# Patient Record
Sex: Female | Born: 1970 | Hispanic: Yes | Marital: Single | Smoking: Never smoker
Health system: Southern US, Community
[De-identification: ages and names within clinical notes are randomized; demographics above are authoritative.]

## PROBLEM LIST (undated history)

## (undated) DIAGNOSIS — K219 Gastro-esophageal reflux disease without esophagitis: Secondary | ICD-10-CM

## (undated) DIAGNOSIS — F32A Depression, unspecified: Secondary | ICD-10-CM

## (undated) DIAGNOSIS — F419 Anxiety disorder, unspecified: Secondary | ICD-10-CM

## (undated) DIAGNOSIS — T7840XA Allergy, unspecified, initial encounter: Secondary | ICD-10-CM

## (undated) DIAGNOSIS — F329 Major depressive disorder, single episode, unspecified: Secondary | ICD-10-CM

## (undated) DIAGNOSIS — D649 Anemia, unspecified: Secondary | ICD-10-CM

## (undated) HISTORY — DX: Anemia, unspecified: D64.9

## (undated) HISTORY — DX: Anxiety disorder, unspecified: F41.9

## (undated) HISTORY — DX: Major depressive disorder, single episode, unspecified: F32.9

## (undated) HISTORY — DX: Allergy, unspecified, initial encounter: T78.40XA

## (undated) HISTORY — DX: Gastro-esophageal reflux disease without esophagitis: K21.9

## (undated) HISTORY — DX: Depression, unspecified: F32.A

---

## 2000-03-03 ENCOUNTER — Emergency Department (HOSPITAL_COMMUNITY): Admission: EM | Admit: 2000-03-03 | Discharge: 2000-03-03 | Payer: Self-pay | Admitting: Emergency Medicine

## 2000-03-17 ENCOUNTER — Emergency Department (HOSPITAL_COMMUNITY): Admission: EM | Admit: 2000-03-17 | Discharge: 2000-03-17 | Payer: Self-pay | Admitting: Emergency Medicine

## 2000-05-11 ENCOUNTER — Emergency Department (HOSPITAL_COMMUNITY): Admission: EM | Admit: 2000-05-11 | Discharge: 2000-05-11 | Payer: Self-pay | Admitting: *Deleted

## 2006-03-17 ENCOUNTER — Other Ambulatory Visit: Admission: RE | Admit: 2006-03-17 | Discharge: 2006-03-17 | Payer: Self-pay | Admitting: Gynecology

## 2011-12-01 ENCOUNTER — Other Ambulatory Visit: Payer: Self-pay | Admitting: Obstetrics and Gynecology

## 2011-12-01 DIAGNOSIS — Z1231 Encounter for screening mammogram for malignant neoplasm of breast: Secondary | ICD-10-CM

## 2011-12-02 ENCOUNTER — Ambulatory Visit (INDEPENDENT_AMBULATORY_CARE_PROVIDER_SITE_OTHER): Payer: Self-pay | Admitting: *Deleted

## 2011-12-02 ENCOUNTER — Ambulatory Visit (HOSPITAL_COMMUNITY)
Admission: RE | Admit: 2011-12-02 | Discharge: 2011-12-02 | Disposition: A | Discharge status: Home / Self Care | Payer: Self-pay | Source: Ambulatory Visit | Attending: Obstetrics and Gynecology | Admitting: Obstetrics and Gynecology

## 2011-12-02 VITALS — BP 98/61 | HR 65 | Temp 97.5°F | Resp 16 | Ht <= 58 in | Wt 145.5 lb

## 2011-12-02 DIAGNOSIS — Z1231 Encounter for screening mammogram for malignant neoplasm of breast: Secondary | ICD-10-CM

## 2011-12-02 DIAGNOSIS — Z1239 Encounter for other screening for malignant neoplasm of breast: Secondary | ICD-10-CM

## 2011-12-02 NOTE — Progress Notes (Signed)
No complaints today.  Pap Smear:    Pap smear not performed today. Patients last Pap smear was 03/04/10 at one of the free Pap smear screenings and was normal per patient. Per patient she has no history of abnormal Pap smears.Told patient about free Pap smear screenings offered at the [facility] next year if would like to get a Pap smear next year. Per [facility] guidelines if patient has no history of an abnormal Pap smear [facility] will cover Pap smears every 3 years. No Pap smear results in EPIC.   Physical exam: Breasts Breasts symmetrical. No skin abnormalities bilateral breasts. No nipple retraction bilateral breasts. No nipple discharge bilateral breasts. No lymphadenopathy. No lumps palpated bilateral breasts. Patient complains of tenderness in left axilla at times but no complaints of tenderness on palpation.        Pelvic/Bimanual No Pap smear completed today since last Pap smear was 03/04/10 and normal per patient. Pap smear not indicated per [facility] guidelines.

## 2011-12-02 NOTE — Patient Instructions (Signed)
Taught patient how to perform BSE and gave educational materials to take home. Patient did not need a Pap smear today due to last Pap smear was 03/04/10 at one of the free Pap smear screenings per patient. Patient brought letter showing Pap smear appointment. Told patient about free cervical cancer screenings to receive a Pap smear if would like one next year. Let her know [facility] will cover Pap smears every 3 years unless has a history of abnormal Pap smears. Patient escorted to mammography for screening mammogram. Let patient know will follow up with her within the next couple weeks with results by letter or phone. Patient verbalized understanding.

## 2013-06-23 ENCOUNTER — Other Ambulatory Visit: Payer: Self-pay | Admitting: Obstetrics and Gynecology

## 2013-06-23 DIAGNOSIS — Z1231 Encounter for screening mammogram for malignant neoplasm of breast: Secondary | ICD-10-CM

## 2013-07-05 ENCOUNTER — Inpatient Hospital Stay (HOSPITAL_COMMUNITY): Admission: RE | Admit: 2013-07-05 | Payer: Self-pay | Source: Ambulatory Visit

## 2013-07-05 ENCOUNTER — Ambulatory Visit (HOSPITAL_COMMUNITY): Admission: RE | Admit: 2013-07-05 | Payer: Self-pay | Source: Ambulatory Visit

## 2013-09-27 ENCOUNTER — Ambulatory Visit (HOSPITAL_COMMUNITY): Payer: Self-pay

## 2013-09-27 ENCOUNTER — Ambulatory Visit (HOSPITAL_COMMUNITY)
Admission: RE | Admit: 2013-09-27 | Discharge: 2013-09-27 | Disposition: A | Discharge status: Home / Self Care | Payer: Self-pay | Source: Ambulatory Visit | Attending: Obstetrics and Gynecology | Admitting: Obstetrics and Gynecology

## 2013-09-27 DIAGNOSIS — Z1231 Encounter for screening mammogram for malignant neoplasm of breast: Secondary | ICD-10-CM

## 2013-10-06 ENCOUNTER — Other Ambulatory Visit: Payer: Self-pay | Admitting: Obstetrics and Gynecology

## 2013-10-06 DIAGNOSIS — Z1231 Encounter for screening mammogram for malignant neoplasm of breast: Secondary | ICD-10-CM

## 2013-10-18 ENCOUNTER — Ambulatory Visit (HOSPITAL_COMMUNITY)
Admission: RE | Admit: 2013-10-18 | Discharge: 2013-10-18 | Disposition: A | Discharge status: Home / Self Care | Payer: Self-pay | Source: Ambulatory Visit | Attending: Obstetrics and Gynecology | Admitting: Obstetrics and Gynecology

## 2013-10-18 ENCOUNTER — Encounter (INDEPENDENT_AMBULATORY_CARE_PROVIDER_SITE_OTHER): Payer: Self-pay

## 2013-10-18 ENCOUNTER — Encounter (HOSPITAL_COMMUNITY): Payer: Self-pay

## 2013-10-18 VITALS — BP 100/60 | Temp 98.7°F | Ht 59.0 in | Wt 142.6 lb

## 2013-10-18 DIAGNOSIS — Z01419 Encounter for gynecological examination (general) (routine) without abnormal findings: Secondary | ICD-10-CM

## 2013-10-18 DIAGNOSIS — Z1231 Encounter for screening mammogram for malignant neoplasm of breast: Secondary | ICD-10-CM

## 2013-10-18 NOTE — Progress Notes (Signed)
Complaints of occasional bilateral breast tenderness.  Pap Smear:    Pap smear completed today. Patients last Pap smear was 03/04/2010 at one of the free cervical cancer screenings offered by [facility] and normal. Per patient has no history of an abnormal Pap smear. No Pap smear results in EPIC.  Physical exam: Breasts Breasts symmetrical. No skin abnormalities bilateral breasts. No nipple retraction bilateral breasts. No nipple discharge bilateral breasts. No lymphadenopathy. No lumps palpated bilateral breasts. No complaints of pain or tenderness on exam. Patient escorted to mammography for a screening mammogram.         Pelvic/Bimanual   Ext Genitalia No lesions, no swelling and no discharge observed on external genitalia.         Vagina Vagina pink and normal texture. No lesions or discharge observed in vagina.          Cervix Cervix is present. Cervix pink and of normal texture. Small amount of menstrual bleeding observed on exam. Per patient menstrual period is due to start.      Uterus Uterus is present and palpable. Uterus in normal position and normal size.        Adnexae Bilateral ovaries present and palpable. No tenderness on palpation.          Rectovaginal No rectal exam completed today since patient had no rectal complaints. No skin abnormalities observed on exam.

## 2013-10-18 NOTE — Patient Instructions (Signed)
Taught Ashley Meyers how to perform BSE and gave educational materials to take home. Let her know [facility] will cover Pap smears every 3 years unless has a history of abnormal Pap smears. Let patient know will follow up with her within the next couple weeks with results with results by letter and/or phone. Ashley Meyers verbalized understanding. Patient escorted to mammography for a screening mammogram.  Dorrien Grunder, Kathaleen Maser, RN 9:39 AM

## 2013-10-20 ENCOUNTER — Telehealth (HOSPITAL_COMMUNITY): Payer: Self-pay | Admitting: *Deleted

## 2013-10-20 NOTE — Telephone Encounter (Signed)
Telephoned patient at home # and discussed results of negative pap smear. Next pap smear due in 3 years. Patient voiced understanding.  

## 2013-10-25 ENCOUNTER — Other Ambulatory Visit: Payer: Self-pay | Admitting: Obstetrics and Gynecology

## 2013-10-25 DIAGNOSIS — R928 Other abnormal and inconclusive findings on diagnostic imaging of breast: Secondary | ICD-10-CM

## 2013-11-11 ENCOUNTER — Ambulatory Visit
Admission: RE | Admit: 2013-11-11 | Discharge: 2013-11-11 | Disposition: A | Discharge status: Home / Self Care | Payer: No Typology Code available for payment source | Source: Ambulatory Visit | Attending: Obstetrics and Gynecology | Admitting: Obstetrics and Gynecology

## 2013-11-11 DIAGNOSIS — R928 Other abnormal and inconclusive findings on diagnostic imaging of breast: Secondary | ICD-10-CM

## 2014-04-11 ENCOUNTER — Telehealth (HOSPITAL_COMMUNITY): Payer: Self-pay | Admitting: *Deleted

## 2014-04-11 NOTE — Telephone Encounter (Signed)
Telephoned patient at home # and left message to return call to [facility] 

## 2014-05-24 ENCOUNTER — Other Ambulatory Visit: Payer: Self-pay | Admitting: Obstetrics and Gynecology

## 2014-05-24 DIAGNOSIS — N63 Unspecified lump in unspecified breast: Secondary | ICD-10-CM

## 2014-06-02 ENCOUNTER — Ambulatory Visit
Admission: RE | Admit: 2014-06-02 | Discharge: 2014-06-02 | Disposition: A | Discharge status: Home / Self Care | Payer: No Typology Code available for payment source | Source: Ambulatory Visit | Attending: Obstetrics and Gynecology | Admitting: Obstetrics and Gynecology

## 2014-06-02 ENCOUNTER — Encounter (INDEPENDENT_AMBULATORY_CARE_PROVIDER_SITE_OTHER): Payer: Self-pay

## 2014-06-02 DIAGNOSIS — N63 Unspecified lump in unspecified breast: Secondary | ICD-10-CM

## 2014-10-14 ENCOUNTER — Emergency Department (HOSPITAL_COMMUNITY): Admission: EM | Admit: 2014-10-14 | Discharge: 2014-10-14 | Discharge status: Absent without leave | Payer: Self-pay

## 2014-10-23 ENCOUNTER — Encounter (HOSPITAL_COMMUNITY): Payer: Self-pay

## 2014-11-07 ENCOUNTER — Other Ambulatory Visit (HOSPITAL_COMMUNITY): Payer: Self-pay | Admitting: *Deleted

## 2014-11-07 DIAGNOSIS — R921 Mammographic calcification found on diagnostic imaging of breast: Secondary | ICD-10-CM

## 2014-11-23 ENCOUNTER — Ambulatory Visit
Admission: RE | Admit: 2014-11-23 | Discharge: 2014-11-23 | Disposition: A | Discharge status: Home / Self Care | Payer: No Typology Code available for payment source | Source: Ambulatory Visit | Attending: Obstetrics and Gynecology | Admitting: Obstetrics and Gynecology

## 2014-11-23 ENCOUNTER — Encounter (HOSPITAL_COMMUNITY): Payer: Self-pay

## 2014-11-23 ENCOUNTER — Ambulatory Visit (HOSPITAL_COMMUNITY)
Admission: RE | Admit: 2014-11-23 | Discharge: 2014-11-23 | Disposition: A | Discharge status: Home / Self Care | Payer: Self-pay | Source: Ambulatory Visit | Attending: Obstetrics and Gynecology | Admitting: Obstetrics and Gynecology

## 2014-11-23 VITALS — BP 102/68 | Ht 59.0 in | Wt 151.0 lb

## 2014-11-23 DIAGNOSIS — Z1239 Encounter for other screening for malignant neoplasm of breast: Secondary | ICD-10-CM

## 2014-11-23 DIAGNOSIS — R921 Mammographic calcification found on diagnostic imaging of breast: Secondary | ICD-10-CM

## 2014-11-23 NOTE — Patient Instructions (Signed)
Explained to [name] that she did not need a Pap smear today due to last Pap smear was 10/18/2013. Let her know [facility] will cover Pap smears every 3 years unless has a history of abnormal Pap smears. Referred patient to the [facility] for diagnostic mammogram per recommendation. Appointment scheduled for Thursday, November 23, 2014 at 1030. Patient aware of appointment and will be there. Macaria Lucretia Roers verbalized understanding.  Makaylie Dedeaux, [name], RN 9:24 AM

## 2014-11-23 NOTE — Progress Notes (Signed)
Complaints of occasional pulsating discomfort bilateral breasts. Patient rated pain at a 2 out of 10.  Pap Smear:  Pap smear not completed today. Last Pap smear was 10/18/2013 at [facility] and normal. Per patient has no history of an abnormal Pap smear. Last Pap smear result is in EPIC.  Physical exam: Breasts Breasts symmetrical. No skin abnormalities bilateral breasts. No nipple retraction bilateral breasts. No nipple discharge bilateral breasts. No lymphadenopathy. No lumps palpated bilateral breasts. No complaints of pain or tenderness on exam. Patient escorted to mammography for a screening mammogram. Referred patient to the [facility] for diagnostic mammogram per recommendation. Appointment scheduled for Thursday, November 23, 2014 at 1030.  Pelvic/Bimanual No Pap smear completed today since last Pap smear was 10/18/2013. Pap smear not indicated per [facility] guidelines.

## 2014-11-24 ENCOUNTER — Ambulatory Visit (HOSPITAL_BASED_OUTPATIENT_CLINIC_OR_DEPARTMENT_OTHER): Payer: No Typology Code available for payment source

## 2014-11-24 ENCOUNTER — Other Ambulatory Visit: Payer: No Typology Code available for payment source

## 2014-11-24 VITALS — BP 105/70 | HR 60 | Temp 98.5°F | Resp 14 | Ht <= 58 in | Wt 147.5 lb

## 2014-11-24 DIAGNOSIS — Z0000 Encounter for general adult medical examination without abnormal findings: Secondary | ICD-10-CM

## 2014-11-24 LAB — GLUCOSE (CC13): Glucose: 84 mg/dl (ref 70–140)

## 2014-11-24 NOTE — Progress Notes (Signed)
Patient is a new patient to the [facility] program and is currently a [facility] patient effective 11/23/2014.   Clinical Measurements: Patient is 4 ft. 6 1/2 inches, weight 147.5 lbs, waist circumference 36 inches, and hip circumference 41.5 inches.   Medical History: Patient has no history of high cholesterol or diabetes.Patient states has no history of hypertension.   Per patient no diagnosed history of coronary heart disease, heart attack, heart failure, stroke/TIA, vascular disease or congenital heart defects.   Blood Pressure, Self-measurement: Patient states that does not have any reason to check Blood pressure.   Nutrition Assessment: Patient stated that eats 1 fruit every day. Patient states she eats one servings of vegetables a day. Per patient does not eat 3 or more ounces of whole grains daily. Patient states doesn't eat two or more servings of fish weekly. Patient states she does not drink more than 36 ounces or 450 calories of beverages with added sugars weekly. Patient stated she does watch her salt intake.   Physical Activity Assessment: Patient states that walks to third floor 4 to 6 times a day, cleans, walks dog for around 1110 minutes a week. Patient states runs after kids on playground for around 60 minutes of vigorous exercise a week.  Smoking Status: Patient had never smoked and is not around smoke.  Quality of Life Assessment: In assessing patient's quality of life she stated that out of the past 30 days that she has felt her Physical health was good all days. Patient also stated that in the past 30 days that her mental health is not good including stress, depression and problems with emotions for 2 to 3 days. Patient did state that out of the past 30 days she felt her physical or mental health had not kept her from doing her usual activities including self-care, work or recreation.   Plan: Lab work will be done today including a lipid panel, blood glucose, and Hgb A1C. Will call  lab results when they are finished. Will discuss Health Coaching using the [facility] when lab results called and her readiness level.

## 2014-11-24 NOTE — Patient Instructions (Signed)
Discussed health assessment with patient. Will decide on programs she wants when call lab results. Let patient know that will call her on Monday with lab results and make appointment at [facility] if needed. Informed not to let [facility] do any labs or procedures until goes to eligibility and get orange card or patient assistance. Patient verbalized understanding.

## 2014-11-25 LAB — LIPID PANEL
Cholesterol: 175 mg/dL (ref 0–200)
HDL: 67 mg/dL (ref >39)
LDL Cholesterol: 87 mg/dL (ref 0–99)
Total CHOL/HDL Ratio: 2.6 Ratio
Triglycerides: 107 mg/dL (ref <150)
VLDL: 21 mg/dL (ref 0–40)

## 2014-11-25 LAB — HEMOGLOBIN A1C
Hgb A1c MFr Bld: 5.9 % — ABNORMAL HIGH (ref <5.7)
Mean Plasma Glucose: 123 mg/dL — ABNORMAL HIGH (ref <117)

## 2014-11-27 ENCOUNTER — Telehealth: Payer: Self-pay

## 2014-11-27 NOTE — Telephone Encounter (Signed)
Called to give lab results and schedule for Health Coaching.

## 2014-12-01 ENCOUNTER — Ambulatory Visit: Payer: Self-pay

## 2014-12-01 NOTE — Patient Instructions (Signed)
Patient will follow 1600 calorie diet plan. Will review all handouts and exercise/activity book. Will increase exercise and use pedometer. Will measure portion sizes. Will call if has any questions. Will call patient in three weeks. Patient verbalized understanding. 

## 2014-12-01 NOTE — Progress Notes (Signed)
Patient returns today for Health Coaching regarding Nutrition for her borderline AIC and activity.   NUTRITION: Patient and I went over Leith Numbers again.Patient reviewed A1C handout to see about prediabetes, normal and diabetes range. Patient went over what prediabetes is, complications that can result if do not get levels to normal, and diabetes level. Discussed increasing fiber in diet, reading labels and serving sizes. Discussed watching carbohydrates, how to count them, serving sizes and number of carbs per day allowance. Patient stated that she knew she was eating too much and knew what to do.. Patient and I went over 1,600 cal diet and we broke it down to the number of servings she could have. Patient received and reviewed the following handouts in Spanish: My Plate, Carb counting Menu, prediabetes, A1C Exam, Make half your grains whole, choosing whole grains, and A1C. Gave patient measuring cup to measure serving sizes, snack container, water bottle, and demonstrated the serving sizes.  ACTIVITY: Discussed activity and walking. Patient received pedometer. Pedometer was explained and shown how it works. Patients goal is to take 7,500 steps a day at least 3 times a week. Received and reviewed Exercise and Physical Activity [employer] book in Spanish. Given stretch band for exercising.  PLAN: Increase amount of walking per day and add other exercises to plan. Decrease carbohydrates in diet. Lose weight. Follow up times three per phone unless needs and can call for questions. Recheck A1C in 3 months.

## 2015-04-30 ENCOUNTER — Telehealth: Payer: Self-pay

## 2015-04-30 NOTE — Telephone Encounter (Signed)
Patient was not available for 2nd health coaching concerning A1c and LDL's.  Will call back.

## 2015-05-01 NOTE — Telephone Encounter (Signed)
Called per Lavon Paganini (Hispanic Interpreter) for [facility] regarding Health Coaching. Was unable to reach patient. Will attempt again.

## 2015-07-12 ENCOUNTER — Telehealth: Payer: Self-pay

## 2015-07-12 NOTE — Telephone Encounter (Signed)
Called per Anastasio Auerbach (Hispanic Interpreter) for [facility] regarding Health Coaching. Patient stated that was good. Per patient is still watching her carbohydrates and sweets. . Patient stated that was doing good on [facility](15 min.).

## 2015-08-23 ENCOUNTER — Ambulatory Visit: Payer: Self-pay | Admitting: Podiatry

## 2015-08-31 ENCOUNTER — Emergency Department (HOSPITAL_COMMUNITY)
Admission: EM | Admit: 2015-08-31 | Discharge: 2015-08-31 | Discharge status: Against Medical Advice | Payer: Self-pay | Attending: Emergency Medicine | Admitting: Emergency Medicine

## 2015-08-31 ENCOUNTER — Encounter (HOSPITAL_COMMUNITY): Payer: Self-pay | Admitting: *Deleted

## 2015-08-31 DIAGNOSIS — R1032 Left lower quadrant pain: Secondary | ICD-10-CM | POA: Insufficient documentation

## 2015-08-31 MED ORDER — OXYCODONE-ACETAMINOPHEN 5-325 MG PO TABS
ORAL_TABLET | ORAL | Status: AC
  Filled 2015-08-31: qty 1

## 2015-08-31 MED ORDER — OXYCODONE-ACETAMINOPHEN 5-325 MG PO TABS: 1.0000 | ORAL_TABLET | Freq: Once | ORAL | Status: DC

## 2015-08-31 NOTE — ED Notes (Addendum)
Pt reports LLQ pain x 4 months, has been to pcp and had US done which diagnosed her with ovarian cyst. Pt reports still having severe pain. Denies n/v/d. No relief with ibuprofen at home.

## 2015-08-31 NOTE — ED Notes (Signed)
Pt refused labs at triage, states she had them done recently (in July) and did not want them repeated. Also refused pain medication.

## 2015-08-31 NOTE — ED Notes (Addendum)
Called to go back to room and unable to locate pt.

## 2015-09-11 ENCOUNTER — Other Ambulatory Visit (HOSPITAL_COMMUNITY): Payer: Self-pay | Admitting: Nurse Practitioner

## 2015-09-11 DIAGNOSIS — N838 Other noninflammatory disorders of ovary, fallopian tube and broad ligament: Secondary | ICD-10-CM

## 2015-09-13 ENCOUNTER — Ambulatory Visit (HOSPITAL_COMMUNITY): Payer: Self-pay

## 2015-09-19 ENCOUNTER — Encounter (HOSPITAL_COMMUNITY): Payer: Self-pay

## 2015-09-19 ENCOUNTER — Ambulatory Visit (HOSPITAL_COMMUNITY)
Admission: RE | Admit: 2015-09-19 | Discharge: 2015-09-19 | Disposition: A | Discharge status: Home / Self Care | Payer: Self-pay | Source: Ambulatory Visit | Attending: Nurse Practitioner | Admitting: Nurse Practitioner

## 2015-09-19 DIAGNOSIS — N838 Other noninflammatory disorders of ovary, fallopian tube and broad ligament: Secondary | ICD-10-CM

## 2015-09-19 DIAGNOSIS — N839 Noninflammatory disorder of ovary, fallopian tube and broad ligament, unspecified: Secondary | ICD-10-CM | POA: Insufficient documentation

## 2015-09-19 DIAGNOSIS — N7011 Chronic salpingitis: Secondary | ICD-10-CM | POA: Insufficient documentation

## 2015-09-19 MED ORDER — IOHEXOL 300 MG/ML  SOLN
100.0000 mL | Freq: Once | INTRAMUSCULAR | Status: AC | PRN
  Administered 2015-09-19: 100 mL via INTRAVENOUS

## 2015-09-24 ENCOUNTER — Ambulatory Visit (INDEPENDENT_AMBULATORY_CARE_PROVIDER_SITE_OTHER): Payer: Self-pay | Admitting: Obstetrics & Gynecology

## 2015-09-24 ENCOUNTER — Encounter: Payer: Self-pay | Admitting: Obstetrics & Gynecology

## 2015-09-24 VITALS — BP 95/54 | HR 62 | Temp 97.8°F | Ht <= 58 in | Wt 150.7 lb

## 2015-09-24 DIAGNOSIS — R102 Pelvic and perineal pain: Secondary | ICD-10-CM

## 2015-09-24 DIAGNOSIS — Z118 Encounter for screening for other infectious and parasitic diseases: Secondary | ICD-10-CM

## 2015-09-24 DIAGNOSIS — Z113 Encounter for screening for infections with a predominantly sexual mode of transmission: Secondary | ICD-10-CM

## 2015-09-24 MED ORDER — IBUPROFEN 800 MG PO TABS: 800.0000 mg | ORAL_TABLET | Freq: Three times a day (TID) | ORAL | Status: DC | PRN

## 2015-09-24 NOTE — Progress Notes (Signed)
Patient ID: Ashley Meyers, female   DOB: Jul 08, 1971, 44 y.o.   MRN: [id number]  Chief Complaint  Patient presents with  . Follow-up    from ER; ovarian cyst & pelvic pain  CT scan was done  HPI Ashley Meyers is a 44 y.o. female.  [id number] Patient's last menstrual period was 09/14/2015. Several months of LLQ pain worsening. Was told us done at outside location showed a left ovarian cyst and CT done 9/23 showed this too. Previously  Rx ibuprofen worked for pain but ran out of medication HPI  History reviewed. No pertinent past medical history.  History reviewed. No pertinent past surgical history.  History reviewed. No pertinent family history.  Social History Social History  Substance Use Topics  . Smoking status: Never Smoker   . Smokeless tobacco: None  . Alcohol Use: No    No Known Allergies  No current outpatient prescriptions on file.   No current facility-administered medications for this visit.    Review of Systems Review of Systems  Constitutional: Negative.   Gastrointestinal: Negative.   Genitourinary: Positive for pelvic pain. Negative for frequency, flank pain, vaginal bleeding and vaginal discharge.    Blood pressure 95/54, pulse 62, temperature 97.8 F (36.6 C), temperature source Oral, height 4\' 8"  (1.422 m), weight 150 lb 11.2 oz (68.357 kg), last menstrual period 09/14/2015.  Physical Exam Physical Exam  Constitutional: She is oriented to person, place, and time. She appears well-developed.  Mild discomfort  Cardiovascular: Normal rate.   Pulmonary/Chest: Effort normal.  Abdominal: Soft. She exhibits no mass. There is no tenderness.  Genitourinary: Vagina normal and uterus normal. No vaginal discharge found.  No mass, STD probe and wet prep done no CMT  Neurological: She is alert and oriented to person, place, and time.  Skin: Skin is warm and dry.  Psychiatric: She has a normal mood and affect. Her behavior is normal.    Data  Reviewed CT-scan of the pelvis  CLINICAL DATA: Left lower quadrant pain x4 months, evaluate complex left ovarian lesion on ultrasound  EXAM: CT PELVIS WITH CONTRAST  TECHNIQUE: Multidetector CT imaging of the pelvis was performed using the standard protocol following the bolus administration of intravenous contrast.  CONTRAST: 159mL OMNIPAQUE IOHEXOL 300 MG/ML SOLN  COMPARISON: None.  FINDINGS: Uterus is notable for uterine fibroids, including a dominant 2.6 cm subserosal fibroid along the posterior uterine fundus (coronal image 41).  Right ovary is within normal limits.  Left ovary is notable for a 2.8 x 1.8 x 2.6 cm cystic lesion, mildly complex with thin septations (series 2/image 26), as well as an enhancing rim, but no definite solid components/mural nodularity. The lesion measures above simple fluid density (20-25 HUs), possibly hemorrhagic. Differential considerations include a hemorrhagic cyst versus an endometrioma (favored), less likely benign ovarian neoplasm.  Dilated tubular lesion in the left adnexa (series 2/ image 25) suggests a hydrosalpinx.  No pelvic ascites.  No suspicious pelvic lymphadenopathy.  Bladder is mildly thick-walled.  Visualized bowel is unremarkable.  Visualized osseous structures are within normal limits.  IMPRESSION: 2.8 cm complex cystic lesion in the left adnexa, without overt malignant features, favored to reflect an endometrioma or hemorrhagic cyst, less likely a benign ovarian neoplasm.  Associated left hydrosalpinx.  Follow-up pelvic ultrasound is suggested in 6-12 weeks.   Electronically Signed  By: Julian Hy M.D.  On: 09/19/2015 15:22  INDICATION: Left lower quadrant pain. Cyst.    TECHNIQUE: Transabdominal and transvaginal pelvic ultrasound    FINDINGS:  The uterus is anteverted measuring 10.1 x 3.3 x 5.3 cm. 2.2 x 1.5 x 2.5 cm posterior fundal fibroid. 2.8 x 2.6 x 1.9 cm  anterior fundal fibroid. 0.9 x 0.8 x 0.9 cm fibroid left corpus. 10 mm endometrial stripe thickness. Normal right ovary   measures 3. 2 x 2 x 2.3 cm. Left ovary measures 2.6 x 1.6 x 2.2 cm and is remarkable for a 1 cm nonvascular echogenic lesion with through-transmission of sound suggesting a endometrioma or hemorrhagic cyst. Right ovarian flow was not documentable and the  right ovary is seen only on the transabdominal study. Left ovary demonstrates both arterial and venous flow. There is a complex additional left adnexal lesion measuring 3.4 x 2.3 x 3.5 cm superior and anterior to the uterus and left ovary. Question   abnormal loop of bowel. No ascites.      IMPRESSION:   1. Uterine fibroids  2. 10 mm endometrial stripe thickness  3. Normal right ovary  4. 1 cm left ovarian lesion as above suggesting endometrioma or hemorrhagic cyst.   5. Complex left adnexal lesion, etiology indeterminate. Suggest CT to further evaluate.    Electronically Signed by Marisue Humble Auringer    US Pelvis And Transvag Non Ob (08/07/2015 8:45 AM)  Procedure Note  Interface, External Ris In - 08/07/2015 9:28 AM EDT Interface, External Ris In - 08/07/2015 9:28 AM EDT  INDICATION: Left lower quadrant pain. Cyst.  TECHNIQUE: Transabdominal and transvaginal pelvic ultrasound  FINDINGS: The uterus is anteverted measuring 10.1 x 3.3 x 5.3 cm. 2.2 x 1.5 x 2.5 cm posterior fundal fibroid. 2.8 x 2.6 x 1.9 cm anterior fundal fibroid. 0.9 x 0.8 x 0.9 cm fibroid left corpus. 10 mm endometrial stripe thickness. Normal right ovary  measures 3. 2 x 2 x 2.3 cm. Left ovary measures 2.6 x 1.6 x 2.2 cm and is remarkable for a 1 cm nonvascular echogenic lesion with through-transmission of sound suggesting a endometrioma or hemorrhagic cyst. Right ovarian flow was not documentable and the right ovary is seen only on the transabdominal study. Left ovary demonstrates both arterial and venous flow. There is a complex additional  left adnexal lesion measuring 3.4 x 2.3 x 3.5 cm superior and anterior to the uterus and left ovary. Question  abnormal loop of bowel. No ascites.   IMPRESSION:  1. Uterine fibroids 2. 10 mm endometrial stripe thickness 3. Normal right ovary 4. 1 cm left ovarian lesion as above suggesting endometrioma or hemorrhagic cyst.  5. Complex left adnexal lesion, etiology indeterminate. Suggest CT to further evaluate.  Electronically Signed by Marjory Lies     Assessment    LLQ pelvic pain with ovarian cyst and possible hydrosalpinx   CT and [place] result reviewed  Plan    Ibuprofen 800 mg TID prn pain Repeat US in 5 weeks  RTC 6 weeks  ROI from PCP, need to have [place] result        Gigi Onstad 09/24/2015, 3:43 PM

## 2015-09-24 NOTE — Patient Instructions (Signed)
Pelvic Pain Female pelvic pain can be caused by many different things and start from a variety of places. Pelvic pain refers to pain that is located in the lower half of the abdomen and between your hips. The pain may occur over a short period of time (acute) or may be reoccurring (chronic). The cause of pelvic pain may be related to disorders affecting the female reproductive organs (gynecologic), but it may also be related to the bladder, kidney stones, an intestinal complication, or muscle or skeletal problems. Getting help right away for pelvic pain is important, especially if there has been severe, sharp, or a sudden onset of unusual pain. It is also important to get help right away because some types of pelvic pain can be life threatening.  CAUSES  Below are only some of the causes of pelvic pain. The causes of pelvic pain can be in one of several categories.   Gynecologic.  Pelvic inflammatory disease.  Sexually transmitted infection.  Ovarian cyst or a twisted ovarian ligament (ovarian torsion).  Uterine lining that grows outside the uterus (endometriosis).  Fibroids, cysts, or tumors.  Ovulation.  Pregnancy.  Pregnancy that occurs outside the uterus (ectopic pregnancy).  Miscarriage.  Labor.  Abruption of the placenta or ruptured uterus.  Infection.  Uterine infection (endometritis).  Bladder infection.  Diverticulitis.  Miscarriage related to a uterine infection (septic abortion).  Bladder.  Inflammation of the bladder (cystitis).  Kidney stone(s).  Gastrointestinal.  Constipation.  Diverticulitis.  Neurologic.  Trauma.  Feeling pelvic pain because of mental or emotional causes (psychosomatic).  Cancers of the bowel or pelvis. EVALUATION  Your caregiver will want to take a careful history of your concerns. This includes recent changes in your health, a careful gynecologic history of your periods (menses), and a sexual history. Obtaining your family  history and medical history is also important. Your caregiver may suggest a pelvic exam. A pelvic exam will help identify the location and severity of the pain. It also helps in the evaluation of which organ system may be involved. In order to identify the cause of the pelvic pain and be properly treated, your caregiver may order tests. These tests may include:   A pregnancy test.  Pelvic ultrasonography.  An X-ray exam of the abdomen.  A urinalysis or evaluation of vaginal discharge.  Blood tests. HOME CARE INSTRUCTIONS   Only take over-the-counter or prescription medicines for pain, discomfort, or fever as directed by your caregiver.   Rest as directed by your caregiver.   Eat a balanced diet.   Drink enough fluids to make your urine clear or pale yellow, or as directed.   Avoid sexual intercourse if it causes pain.   Apply warm or cold compresses to the lower abdomen depending on which one helps the pain.   Avoid stressful situations.   Keep a journal of your pelvic pain. Write down when it started, where the pain is located, and if there are things that seem to be associated with the pain, such as food or your menstrual cycle.  Follow up with your caregiver as directed.  SEEK MEDICAL CARE IF:  Your medicine does not help your pain.  You have abnormal vaginal discharge. SEEK IMMEDIATE MEDICAL CARE IF:   You have heavy bleeding from the vagina.   Your pelvic pain increases.   You feel light-headed or faint.   You have chills.   You have pain with urination or blood in your urine.   You have uncontrolled diarrhea   or vomiting.   You have a fever or persistent symptoms for more than 3 days.  You have a fever and your symptoms suddenly get worse.   You are being physically or sexually abused.  MAKE SURE YOU:  Understand these instructions.  Will watch your condition.  Will get help if you are not doing well or get worse. Document Released:  11/04/2004 Document Revised: 04/24/2014 Document Reviewed: 03/29/2012 ExitCare Patient Information 2015 [employer]. This information is not intended to replace advice given to you by your health care provider. Make sure you discuss any questions you have with your health care provider.  

## 2015-09-25 LAB — WET PREP, GENITAL
Clue Cells Wet Prep HPF POC: NONE SEEN
Trich, Wet Prep: NONE SEEN
WBC, Wet Prep HPF POC: NONE SEEN
Yeast Wet Prep HPF POC: NONE SEEN

## 2015-09-26 ENCOUNTER — Encounter: Payer: Self-pay | Admitting: Obstetrics & Gynecology

## 2015-09-26 LAB — GC/CHLAMYDIA PROBE AMP (~~LOC~~) NOT AT ARMC
Chlamydia: NEGATIVE
Neisseria Gonorrhea: NEGATIVE

## 2015-10-30 ENCOUNTER — Ambulatory Visit (HOSPITAL_COMMUNITY)
Admission: RE | Admit: 2015-10-30 | Discharge: 2015-10-30 | Disposition: A | Discharge status: Home / Self Care | Payer: Self-pay | Source: Ambulatory Visit | Attending: Obstetrics & Gynecology | Admitting: Obstetrics & Gynecology

## 2015-10-30 DIAGNOSIS — R102 Pelvic and perineal pain: Secondary | ICD-10-CM | POA: Insufficient documentation

## 2015-10-30 DIAGNOSIS — N83202 Unspecified ovarian cyst, left side: Secondary | ICD-10-CM | POA: Insufficient documentation

## 2015-10-30 DIAGNOSIS — D252 Subserosal leiomyoma of uterus: Secondary | ICD-10-CM | POA: Insufficient documentation

## 2015-10-30 DIAGNOSIS — R1032 Left lower quadrant pain: Secondary | ICD-10-CM | POA: Insufficient documentation

## 2015-11-05 ENCOUNTER — Ambulatory Visit: Payer: Self-pay | Admitting: Obstetrics & Gynecology

## 2015-11-12 ENCOUNTER — Telehealth: Payer: Self-pay

## 2015-11-12 NOTE — Telephone Encounter (Signed)
Pt called [facility] and they transferred the call to here. Pt requested results from previous U/S after discussing information from U/S with DR. PRATT I was able to inform pt that she has Fibroids and Cyst. Pt states understanding of diagnosis. Pt was concerned about the pain in which she is experiencing pt states she is having to take 2 Advil every 4 hours or so to help ease the pain. Pt states even with Advil she is still having pain in pelvic area also in her head, neck,leg and right ovary. Informed patient she will need to schedule an appt with a provider to discuss pain management and future plans from diagnosis. Also informed pt about [facility] and if the pain became unbearable that she could go there if need be. Pt reports understanding and plans to call to make a appt here at the clinic.

## 2015-12-05 ENCOUNTER — Telehealth (HOSPITAL_COMMUNITY): Payer: Self-pay | Admitting: *Deleted

## 2015-12-05 NOTE — Telephone Encounter (Signed)
Attempted to call patient with interpreter Almyra Free to remind her that she is past due to follow-up at the [facility] per recommendation. No one answered phone. Left message for patient to call back.

## 2015-12-10 ENCOUNTER — Other Ambulatory Visit (HOSPITAL_COMMUNITY): Payer: Self-pay | Admitting: *Deleted

## 2015-12-10 ENCOUNTER — Telehealth (HOSPITAL_COMMUNITY): Payer: Self-pay | Admitting: *Deleted

## 2015-12-10 DIAGNOSIS — R921 Mammographic calcification found on diagnostic imaging of breast: Secondary | ICD-10-CM

## 2015-12-10 NOTE — Telephone Encounter (Signed)
Telephoned patient at home # and left message

## 2015-12-12 ENCOUNTER — Telehealth (HOSPITAL_COMMUNITY): Payer: Self-pay | Admitting: *Deleted

## 2015-12-12 NOTE — Telephone Encounter (Signed)
Telephoned patient at home # and confirmed appointment with [facility] Dec 22 3:00. Used interpreter Lavon Paganini.

## 2015-12-13 ENCOUNTER — Ambulatory Visit (HOSPITAL_COMMUNITY): Admission: RE | Admit: 2015-12-13 | Payer: Self-pay | Source: Ambulatory Visit

## 2015-12-19 ENCOUNTER — Other Ambulatory Visit: Payer: Self-pay

## 2015-12-28 ENCOUNTER — Encounter (HOSPITAL_COMMUNITY): Payer: Self-pay

## 2015-12-28 ENCOUNTER — Ambulatory Visit (HOSPITAL_COMMUNITY)
Admission: RE | Admit: 2015-12-28 | Discharge: 2015-12-28 | Disposition: A | Discharge status: Home / Self Care | Payer: Self-pay | Source: Ambulatory Visit | Attending: Obstetrics and Gynecology | Admitting: Obstetrics and Gynecology

## 2015-12-28 ENCOUNTER — Ambulatory Visit
Admission: RE | Admit: 2015-12-28 | Discharge: 2015-12-28 | Disposition: A | Discharge status: Home / Self Care | Payer: No Typology Code available for payment source | Source: Ambulatory Visit | Attending: Obstetrics and Gynecology | Admitting: Obstetrics and Gynecology

## 2015-12-28 VITALS — BP 108/62 | Temp 98.3°F | Ht 59.0 in | Wt 155.0 lb

## 2015-12-28 DIAGNOSIS — R921 Mammographic calcification found on diagnostic imaging of breast: Secondary | ICD-10-CM

## 2015-12-28 DIAGNOSIS — Z1239 Encounter for other screening for malignant neoplasm of breast: Secondary | ICD-10-CM

## 2015-12-28 NOTE — Progress Notes (Signed)
No complaints today. Patient had a diagnostic mammogram 11/23/2014 at the [facility] that a 6 month left breast diagnostic mammogram was recommended.  Pap Smear: Pap smear not completed today. Last Pap smear was 10/18/2013 at [facility] and normal. Per patient has no history of an abnormal Pap smear. Last Pap smear result is in EPIC.  Physical exam: Breasts Breasts symmetrical. No skin abnormalities bilateral breasts. No nipple retraction bilateral breasts. No nipple discharge bilateral breasts. No lymphadenopathy. No lumps palpated bilateral breasts. No complaints of pain or tenderness on exam. Referred patient to the [facility] for diagnostic mammogram per recommendation. Appointment scheduled for Friday, December 28, 2015 at 1110.   Pelvic/Bimanual No Pap smear completed today since last Pap smear was 10/18/2013. Pap smear not indicated per BCCCP guidelines.   Smoking History: Patient has never smoked.  Patient Navigation: Patient education provided. Access to services provided for patient through [facility] program.

## 2015-12-28 NOTE — Patient Instructions (Signed)
Educational materials on self breast awareness given. Explained to [name] that she did not need a Pap smear today due to last Pap smear was 10/18/2013. Let her know BCCCP will cover Pap smears every 3 years unless has a history of abnormal Pap smears. Reminded patient that her next Pap smear is due in October 2017 and to call [name] to schedule with BCCCP. Referred patient to the [facility] for diagnostic mammogram per recommendation. Appointment scheduled for Friday, December 28, 2015 at 1110. Patient aware of appointment and will be there. Ashley Meyers verbalized understanding.  [name], [name], RN 12:56 PM

## 2016-02-07 ENCOUNTER — Other Ambulatory Visit: Payer: Self-pay

## 2016-02-07 ENCOUNTER — Ambulatory Visit (HOSPITAL_BASED_OUTPATIENT_CLINIC_OR_DEPARTMENT_OTHER): Payer: Self-pay

## 2016-02-07 VITALS — BP 104/74 | HR 64 | Temp 97.9°F | Resp 20 | Ht <= 58 in | Wt 156.7 lb

## 2016-02-07 DIAGNOSIS — Z0000 Encounter for general adult medical examination without abnormal findings: Secondary | ICD-10-CM

## 2016-02-07 LAB — GLUCOSE (CC13): Glucose: 93 mg/dl (ref 70–140)

## 2016-02-07 NOTE — Progress Notes (Signed)
Patient is returning as a re screen to the [employer] and is currently a B[facility]CP patient effective 12/28/2015. Interpreter told front desk that patient did not need interpreter. Patient voiced frustration at certain time because some things hard to completely understand. Patient was asking about a medical condition, also, and was having trouble getting message across.  Clinical Measurements: Patient is 4 ft. 10 inches, weight 157.6 lbs, BMI 40.2 .   Medical History: Patient has no history of high cholesterol. Patient does not have a history of hypertension or diabetes. Per patient no diagnosed history of coronary heart disease, heart attack, heart failure, stroke/TIA, vascular disease or congenital heart defects. C/O pelvic pain and stated no one  Has been able to help. Per patient drinking a mixture if cinnamon, aloe, lemon and honey has helped the pain.  Blood Pressure, Self-measurement: Patient states has no reason to check Blood pressure  Nutrition Assessment: Patient stated that eats 1 fruit every day. Patient states she eats 1 serving of vegetables a day. Per patient states does eat 3 or more ounces or more of whole grains daily. Patient stated does not eat two or more servings of fish weekly. Patient states she drink less than 36 ounces or 450 calories of beverages with added sugars weekly. Patient stated she does watch her salt intake.  Physical Activity Assessment: Patient stated that does a total of 120 minutes of moderate exercise. Patient rarely does any vigorous exercise.  Smoking Status: Patient has never smoked and is not exposed to smoke.  Quality of Life Assessment: In assessing patient's quality of life she stated that out of the past 30 days that she has felt her health is good all of them. Patient also stated that in the past 30 days that her mental health was good including stress, depression and problems with emotions for all days. Patient did state that out of the past 30  days she felt her physical or mental health had not kept her from doing her usual activities including self-care, work or recreation.   Plan: Lab work was done today including a lipid panel, blood glucose, and Hgb A1C. Will call lab results when they are finished. Will discuss risk reduction counseling and do health coaching when call results.

## 2016-02-07 NOTE — Patient Instructions (Signed)
Discussed health assessment with patient. Will decide on programs she wants when call lab results. Patient verbalized understanding.

## 2016-02-08 LAB — LIPID PANEL
Chol/HDL Ratio: 3 ratio units (ref 0.0–4.4)
Cholesterol, Total: 194 mg/dL (ref 100–199)
HDL: 65 mg/dL (ref >39)
LDL Calculated: 88 mg/dL (ref 0–99)
Triglycerides: 203 mg/dL — ABNORMAL HIGH (ref 0–149)
VLDL Cholesterol Cal: 41 mg/dL — ABNORMAL HIGH (ref 5–40)

## 2016-02-08 LAB — HEMOGLOBIN A1C
Est. average glucose Bld gHb Est-mCnc: 128 mg/dL
Hemoglobin A1c: 6.1 % — ABNORMAL HIGH (ref 4.8–5.6)

## 2016-02-12 ENCOUNTER — Telehealth: Payer: Self-pay

## 2016-02-12 NOTE — Telephone Encounter (Addendum)
Called to inform about lab work from 02/07/16 per interpreter Lavon Paganini.   LAB RESULTS: Informed patient: cholesterol- 194, HDL- 65, LDL- 88, triglycerides - 203, Bld Glucose -93 , HBG-A1C - 6.1, and BMI 32.8.    RISK REDUCTION HEALTH COACHING:  Did risk reduction counseling/Heach Coach concerning related to BMI/Weight and A1C. Discussed for 15 minutes her eating, lower abdominal pain and exercise. Discussed no sugar and watching carbohydrates.Discussed what foods are considered carbohydrates. Discussed My Plate portion sizes. Asked if wanted to come in person to person to do more health coaching. Appointment set up for March 3.  NAVIGATION NEEDS ASSESSMENT AND CARE PLAN: Patient does need additional social support and help understanding services needed. At present time does not lack access to services. Patient is having difficulty understanding why can not find out what is causing her abdominal/pelvic area. Maybe not seeing the right doctor to find out cause. Patient is upset and needs support and direction.   PLAN: Patient will come for more Health Coaching on March 3. Will look at establishing care at one of clinics. Will discuss abdominal pain possible causes. Continue to follow up for 6 months.

## 2016-02-22 ENCOUNTER — Ambulatory Visit: Payer: Self-pay

## 2016-02-22 DIAGNOSIS — Z789 Other specified health status: Secondary | ICD-10-CM

## 2016-02-22 NOTE — Patient Instructions (Signed)
Will loose 7 to 10% of weight. Will slowly stop using sugary items. Will decrease amount of starches patient eats. Will eat healthier. Will add some activity like walking so many days a week.

## 2016-02-22 NOTE — Progress Notes (Signed)
[place] Patient returns today for Health Coaching. Interpreter Anastasio Auerbach present. Patient coming mainly because BMI 32.8 and A1C 6.1.  HEALTH COACHING:  Patient and I went over lab results. Patient gasped when she saw her BMI scale on the chart. Patient's A1C is up from 5.9 in the past. Discussed and wrote out all the portions that should eat a day and serving sizes. Explained that sugars and carbohydrates affected weight the most. Patient received handouts on portions and food group. Went over how could divide her portions through out the day. Discussed taking it slow and decreasing the amounts she is eating. Discussed that needed to cut out honey especially what has been putting in drink. Discussed reading labels for sugars and fiber. Discussed sugar substitute and patient asked about drinking diet soda's. Told patient that was good but not to drink many.    Patient wanted to talk about her pain. She stated that would like to have a PAP smear but is not due to next year. I gave her a flyer on the free PAP smear (cervical) Screenings coming up.   PLAN: Will Call in 3 to 4 weeks. Will decrease starches.Will loose weight.

## 2016-03-07 ENCOUNTER — Encounter (HOSPITAL_COMMUNITY): Payer: Self-pay | Admitting: *Deleted

## 2016-03-07 ENCOUNTER — Inpatient Hospital Stay (HOSPITAL_COMMUNITY)
Admission: AD | Admit: 2016-03-07 | Payer: No Typology Code available for payment source | Source: Ambulatory Visit | Admitting: Obstetrics & Gynecology

## 2016-03-07 ENCOUNTER — Emergency Department (INDEPENDENT_AMBULATORY_CARE_PROVIDER_SITE_OTHER)
Admission: EM | Admit: 2016-03-07 | Discharge: 2016-03-07 | Disposition: A | Discharge status: Nursing Home (Includes ICF) | Payer: Self-pay | Source: Home / Self Care | Attending: Family Medicine | Admitting: Family Medicine

## 2016-03-07 DIAGNOSIS — R102 Pelvic and perineal pain: Secondary | ICD-10-CM

## 2016-03-07 LAB — POCT PREGNANCY, URINE: Preg Test, Ur: NEGATIVE

## 2016-03-07 LAB — POCT URINALYSIS DIP (DEVICE)
Bilirubin Urine: NEGATIVE
Glucose, UA: NEGATIVE mg/dL
Hgb urine dipstick: NEGATIVE
Ketones, ur: NEGATIVE mg/dL
Leukocytes, UA: NEGATIVE
Nitrite: NEGATIVE
Protein, ur: NEGATIVE mg/dL
Specific Gravity, Urine: 1.015 (ref 1.005–1.030)
Urobilinogen, UA: 0.2 mg/dL (ref 0.0–1.0)
pH: 7.5 (ref 5.0–8.0)

## 2016-03-07 MED ORDER — HYDROMORPHONE HCL 1 MG/ML IJ SOLN
2.0000 mg | Freq: Once | INTRAMUSCULAR | Status: AC
  Administered 2016-03-07: 2 mg via INTRAMUSCULAR

## 2016-03-07 MED ORDER — ONDANSETRON 4 MG PO TBDP
ORAL_TABLET | ORAL | Status: AC
  Filled 2016-03-07: qty 1

## 2016-03-07 MED ORDER — ONDANSETRON 4 MG PO TBDP
4.0000 mg | ORAL_TABLET | Freq: Once | ORAL | Status: AC
  Administered 2016-03-07: 4 mg via ORAL

## 2016-03-07 MED ORDER — HYDROMORPHONE HCL 1 MG/ML IJ SOLN
INTRAMUSCULAR | Status: AC
Start: 2016-03-07 — End: 2016-03-07
  Filled 2016-03-07: qty 2

## 2016-03-07 NOTE — ED Notes (Signed)
Pt  Reports  Low  abd  Pain      With  Symptoms  This  Am             Seen  Er    2  Weeks  Ago

## 2016-03-07 NOTE — ED Notes (Signed)
While  Patient  Was   Waiting  For  [place] could  Not  Find  Her she  Must  Have   [place]  Without  Notifying  Staff    Waiting  [place]

## 2016-03-07 NOTE — ED Provider Notes (Addendum)
CSN: [id number]     Arrival date & time 03/07/16  1351 History   First MD Initiated Contact with Patient 03/07/16 1409     Chief Complaint  Patient presents with  . Abdominal Pain   (Consider location/radiation/quality/duration/timing/severity/associated sxs/prior Treatment) Patient is a 45 y.o. female presenting with abdominal pain. The history is provided by the patient.  Abdominal Pain Pain location:  LLQ Pain quality: cramping and sharp   Pain severity:  Severe Onset quality:  Sudden Duration:  4 hours Progression:  Worsening Chronicity:  Recurrent Context comment:  States recurrent sx with dx of cyst on ovary. Associated symptoms: no diarrhea, no dysuria, no fever, no nausea and no vomiting   Associated symptoms comment:  Lmp 10d ago.   History reviewed. No pertinent past medical history. History reviewed. No pertinent past surgical history. History reviewed. No pertinent family history. Social History  Substance Use Topics  . Smoking status: Never Smoker   . Smokeless tobacco: None  . Alcohol Use: No   OB History    Gravida Para Term Preterm AB TAB SAB Ectopic Multiple Living   1 0 0 0 1 0 1 0 0 0      Review of Systems  Constitutional: Negative for fever.  Gastrointestinal: Positive for abdominal pain. Negative for nausea, vomiting and diarrhea.  Genitourinary: Negative for dysuria and menstrual problem.  All other systems reviewed and are negative.   Allergies  Review of patient's allergies indicates no known allergies.  Home Medications   Prior to Admission medications   Medication Sig Start Date End Date Taking? Authorizing Provider  ibuprofen (ADVIL,MOTRIN) 800 MG tablet Take 1 tablet (800 mg total) by mouth every 8 (eight) hours as needed for moderate pain. 09/24/15   Woodroe Mode, MD   Meds Ordered and Administered this Visit   Medications  ondansetron (ZOFRAN-ODT) disintegrating tablet 4 mg (4 mg Oral Given 03/07/16 1430)  HYDROmorphone (DILAUDID)  injection 2 mg (2 mg Intramuscular Given 03/07/16 1430)    BP 112/64 mmHg  Pulse 68  Temp(Src) 98.2 F (36.8 C) (Oral)  Resp 18  SpO2 97%  LMP 02/29/2016 No data found.   Physical Exam  Constitutional: She is oriented to person, place, and time. She appears well-developed and well-nourished. She appears distressed.  Pt appears in distress.  Abdominal: Soft. Bowel sounds are normal. She exhibits no distension and no mass. There is tenderness. There is no rebound and no guarding.  Neurological: She is alert and oriented to person, place, and time.  Skin: Skin is warm and dry.  Nursing note and vitals reviewed.   ED Course  Procedures (including critical care time)  Labs Review Labs Reviewed  POCT URINALYSIS DIP (DEVICE)  POCT PREGNANCY, URINE    Imaging Review No results found.   Visual Acuity Review  Right Eye Distance:   Left Eye Distance:   Bilateral Distance:    Right Eye Near:   Left Eye Near:    Bilateral Near:         MDM   1. Pelvic pain in female        Billy Fischer, MD 03/07/16 [place], MD 03/07/16 [phone]

## 2016-03-07 NOTE — Discharge Instructions (Signed)
Go directly to hosp for further tests .

## 2016-04-07 ENCOUNTER — Telehealth: Payer: Self-pay | Admitting: General Practice

## 2016-04-07 NOTE — Telephone Encounter (Signed)
Patient called and left message stating she has an appt with [place] already but is wanting a sooner appt. Called patient, no answer- left message stating we are trying to reach you to return your phone call, if you would like a sooner appt please contact the front office staff each day for a cancellation. If you have any questions you may call us back

## 2016-04-24 ENCOUNTER — Ambulatory Visit: Payer: No Typology Code available for payment source | Admitting: Obstetrics and Gynecology

## 2016-04-24 NOTE — Progress Notes (Signed)
Pt not seen by provider. Pelvic ultrasound scheduled and pt will return for results.

## 2016-04-24 NOTE — Progress Notes (Signed)
Patient ID: Ashley Meyers, female   DOB: 1971/10/20, 45 y.o.   MRN: [id number] Patient was not seen today. A pelvic ultrasound was ordered to evaluate a complex left ovarian cyst and patient will return to discuss results and further management if indicated

## 2016-04-29 ENCOUNTER — Ambulatory Visit (HOSPITAL_COMMUNITY)
Admission: RE | Admit: 2016-04-29 | Discharge: 2016-04-29 | Disposition: A | Discharge status: Home / Self Care | Payer: No Typology Code available for payment source | Source: Ambulatory Visit | Attending: Obstetrics and Gynecology | Admitting: Obstetrics and Gynecology

## 2016-04-29 DIAGNOSIS — D259 Leiomyoma of uterus, unspecified: Secondary | ICD-10-CM | POA: Insufficient documentation

## 2016-04-29 DIAGNOSIS — R102 Pelvic and perineal pain: Secondary | ICD-10-CM | POA: Insufficient documentation

## 2016-04-30 ENCOUNTER — Telehealth: Payer: Self-pay

## 2016-04-30 NOTE — Telephone Encounter (Signed)
Please inform the patient of a normal ultrasound. The left ovarian cyst previously seen has completely resolved. No need for further ultrasounds at this time No answer or voicemail to leave a message

## 2016-05-05 NOTE — Telephone Encounter (Signed)
Pt has been aware of result of U/S

## 2016-05-09 ENCOUNTER — Telehealth: Payer: Self-pay

## 2016-05-09 NOTE — Telephone Encounter (Signed)
[place] Patient called per phone today for Health Coaching by interpreter Lavon Paganini. Patient's areas of concern were weight, HDL, A1C, and exercise.   HEALTH COACHING:  Patient stated that has been taking Fish oil when asked what was doing to increase HDL. Per patient is eating better especially eating more vegetables. When explored how was doing better with increasing fiber in her diet. Patient stated was checking labels and eating foods higher in fiber. Patient stated is trying to stay away from sugary foods and is not drinking any drinks with sugar.  When asked about whether she had made any changes in her activity level, patient stated that was walking 30 minutes four days a week. Discussed with patient not to forget about adding in some balance and flexibility exercises.  PLAN: Will Call in 2 to 4 weeks. Will continue with increasing exercise. Will continue with the good meal and nutrition changes. Will be doing follow up assessment when call next.

## 2016-05-12 ENCOUNTER — Ambulatory Visit (INDEPENDENT_AMBULATORY_CARE_PROVIDER_SITE_OTHER): Payer: Self-pay | Admitting: Internal Medicine

## 2016-05-12 ENCOUNTER — Encounter: Payer: Self-pay | Admitting: Internal Medicine

## 2016-05-12 VITALS — BP 100/56 | HR 60 | Temp 98.8°F | Resp 14 | Ht <= 58 in | Wt 155.0 lb

## 2016-05-12 DIAGNOSIS — F32A Depression, unspecified: Secondary | ICD-10-CM

## 2016-05-12 DIAGNOSIS — M67432 Ganglion, left wrist: Secondary | ICD-10-CM

## 2016-05-12 DIAGNOSIS — F329 Major depressive disorder, single episode, unspecified: Secondary | ICD-10-CM

## 2016-05-12 DIAGNOSIS — H029 Unspecified disorder of eyelid: Secondary | ICD-10-CM

## 2016-05-12 DIAGNOSIS — F419 Anxiety disorder, unspecified: Secondary | ICD-10-CM
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
## 2016-05-12 NOTE — Patient Instructions (Addendum)
We will check with [facility] about your financial assistance and your wrist and get back to you. Call if you do not hear from [place] in another week. Call when you have the orange card and we will send to Ophtho or Dermatology for your eyelid

## 2016-05-19 ENCOUNTER — Encounter: Payer: Self-pay | Admitting: Internal Medicine

## 2016-05-20 ENCOUNTER — Telehealth: Payer: Self-pay

## 2016-05-20 NOTE — Telephone Encounter (Signed)
Patient called per Ashley Meyers interpreter for final assessment.  ASSESSMENT :This is a follow up Assessment following [place] . Marland Kitchen  Medication Status : Patient states is not taking medication for high cholesterol, hypertension, or diabetes.   Blood Pressure, Self-measurement: Patient states has no reason to check Blood pressure.  Nutrition Assessment: Patient stated that eats 2 fruits every day. Patient states she eats 1 serving of vegetables a day. Per Patient eats 3 or more ounces of whole grains daily. Patient stated doesn't eat two or more servings of fish weekly.  Patient states she does drink more than 36 ounces or 450 calories of beverages with added sugars weekly. Patient stated she does not watch her salt intake.   Physical Activity Assessment: Patient stated she does around 20 minutes of moderate activity and 70 minutes vigorous per week.  Smoking Status: Patient stated has never smoked and is not exposed to smoke.  Quality of Life Assessment: In assessing patient's Physical quality of life she stated that out of the past 30 days that she has felt her health is good all of them. Patient also stated that in the past 30 days that her mental health was good including stress, depression and problems with emotions for all days. Patient did state that out of the past 30 days she felt her physical or mental health had not kept her from doing her usual activities including self-care, work or recreation.   PLAN: Informed patient that would re screen if does not have insurance when and if she returns to [facility] next year.  TIME SPENT with PATIENT: 15 minutes

## 2016-05-23 ENCOUNTER — Telehealth: Payer: Self-pay

## 2016-05-23 NOTE — Telephone Encounter (Signed)
Patient informed to call [facility] and set up a payment plan and redo financial counseling so they can set her up for an appointment with a surgeon to remove the Ganglion on her left wrist. Patient verbalized understanding.

## 2016-05-28 ENCOUNTER — Ambulatory Visit (INDEPENDENT_AMBULATORY_CARE_PROVIDER_SITE_OTHER): Payer: Self-pay | Admitting: Licensed Clinical Social Worker

## 2016-05-28 DIAGNOSIS — F32A Depression, unspecified: Secondary | ICD-10-CM

## 2016-05-28 DIAGNOSIS — F329 Major depressive disorder, single episode, unspecified: Secondary | ICD-10-CM
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
## 2016-07-14 ENCOUNTER — Ambulatory Visit (INDEPENDENT_AMBULATORY_CARE_PROVIDER_SITE_OTHER): Payer: Self-pay | Admitting: Obstetrics and Gynecology

## 2016-07-14 ENCOUNTER — Encounter: Payer: Self-pay | Admitting: Obstetrics and Gynecology

## 2016-07-14 VITALS — BP 97/57 | HR 57 | Ht <= 58 in | Wt 154.0 lb

## 2016-07-14 DIAGNOSIS — Z712 Person consulting for explanation of examination or test findings: Secondary | ICD-10-CM

## 2016-07-14 DIAGNOSIS — Z7189 Other specified counseling: Secondary | ICD-10-CM

## 2016-07-14 DIAGNOSIS — R102 Pelvic and perineal pain: Secondary | ICD-10-CM

## 2016-07-14 NOTE — Progress Notes (Signed)
45 yo G1P0010 presenting today for results of her ultrasound and persistent left lower quadrant pain. Patient is very frustrated upon entering the room. She requested several times to be seen by a doctor after I introduced myself as the doctor on multiple occasions. Patient reports regular menses. She reports the presence of this pelvic pain for over a year now. She is not able to characterize it and asks me to refer back to her medical records. She wants to know which surgical procedure I am willing to take to rid her of her pain. When I ask her any type of questions, she tells me to look into her medical records.  Past Medical History:  Diagnosis Date  . Allergy   . Anxiety   . Depression   . GERD (gastroesophageal reflux disease)    No past surgical history on file. Family History  Problem Relation Age of Onset  . Cancer Mother   . Tuberculosis Father   . Alcohol abuse Brother    Social History  Substance Use Topics  . Smoking status: Never Smoker  . Smokeless tobacco: Never Used  . Alcohol use No   ROS See pertinent in HPI Blood pressure (!) 97/57, pulse (!) 57, height 4\' 9"  (1.448 m), weight 154 lb (69.9 kg), last menstrual period 07/11/2016.  GENERAL: Well-developed, well-nourished female in no acute distress.  Patient declined physical exam  04/29/2016 ultrasound FINDINGS: Uterus  Measurements: 7.2 x 3.9 x 5.8 cm. 3 separate uterine fibroids are visible on the transvaginal study. The largest of these measures 2.8 cm. No fibroids or other mass visualized.  Endometrium  Thickness: 9 mm.  No focal abnormality visualized.  Right ovary  Measurements: 2.6 x 1.8 x 1.1 cm. Normal appearance/no adnexal mass. Normal follicles visualized.  Left ovary  Measurements: 3.9 x 2.8 x 2.1 cm. Normal appearance/no adnexal mass. Normal follicles present. No further evidence of complex cystic abnormality.  Other findings:  No abnormal free fluid  IMPRESSION: Resolution  of complex cystic abnormality of the left ovary seen on the prior study. This presumably represented a hemorrhagic cyst. Both ovaries have a normal appearance. Stable small uterine fibroids.   Electronically Signed   By: Aletta Edouard M.D.   On: 04/29/2016 16:43  A/P 45 yo with chronic pelvic pain - Results of the ultrasound were reviewed with the patient. No GYN etiology of her pain  - Since the patient was not forthcoming with the etiology of her pain, I explained to her that LLQ pain could be as a result of constipation and or diverticulitis and that she should be seen by a Gastroenterologist. Patient was thankful for this information and appreciates the referral - information on free cervical cancer screening sessions prvided

## 2016-10-23 ENCOUNTER — Ambulatory Visit (HOSPITAL_COMMUNITY)
Admission: RE | Admit: 2016-10-23 | Discharge: 2016-10-23 | Disposition: A | Discharge status: Home / Self Care | Payer: Self-pay | Source: Ambulatory Visit | Attending: Obstetrics and Gynecology | Admitting: Obstetrics and Gynecology

## 2016-10-23 NOTE — Progress Notes (Signed)
Unable to complete Pap smear due to patient started menstrual period yesterday.

## 2016-10-25 ENCOUNTER — Encounter (HOSPITAL_COMMUNITY): Payer: Self-pay | Admitting: *Deleted

## 2016-10-25 ENCOUNTER — Ambulatory Visit (HOSPITAL_COMMUNITY)
Admission: EM | Admit: 2016-10-25 | Discharge: 2016-10-25 | Disposition: A | Discharge status: Home / Self Care | Payer: Worker's Compensation | Attending: Emergency Medicine | Admitting: Emergency Medicine

## 2016-10-25 DIAGNOSIS — S0003XA Contusion of scalp, initial encounter: Secondary | ICD-10-CM | POA: Diagnosis not present

## 2016-10-25 DIAGNOSIS — S7011XA Contusion of right thigh, initial encounter: Secondary | ICD-10-CM

## 2016-10-25 DIAGNOSIS — G44319 Acute post-traumatic headache, not intractable: Secondary | ICD-10-CM | POA: Diagnosis not present

## 2016-10-25 NOTE — Discharge Instructions (Signed)
May apply ice to the right leg and the scalp for the next one or 2 days. Recommend taking Tylenol for discomfort. For now no ibuprofen or Aleve. Read instructions regarding head injury. Reviewed the symptoms in which we discussed. If you have any of the signs for head injury seek medical attention promptly

## 2016-10-25 NOTE — ED Provider Notes (Signed)
CSN: [id number]     Arrival date & time 10/25/16  1650 History   First MD Initiated Contact with Patient 10/25/16 1936     Chief Complaint  Patient presents with  . Work Related Injury   (Consider location/radiation/quality/duration/timing/severity/associated sxs/prior Treatment) 45 year old female was at work 2 days ago and describes a machine that was supposed to be bolted to the floor but started spinning and as it did it struck her on the left parietal scalp as well as the medial aspect of the right distal thigh. She states that initially she had some head pain but that has since abated but now has minor headache in the right parietal area. There was no loss of consciousness. Denies problems with vision, speech, hearing, swallowing, unusual sleepiness or lethargy.  There is ecchymosis to the anteromedial aspect of the right distal thigh.       Past Medical History:  Diagnosis Date  . Allergy   . Anxiety   . Depression   . GERD (gastroesophageal reflux disease)    History reviewed. No pertinent surgical history. Family History  Problem Relation Age of Onset  . Cancer Mother   . Tuberculosis Father   . Alcohol abuse Brother    Social History  Substance Use Topics  . Smoking status: Never Smoker  . Smokeless tobacco: Never Used  . Alcohol use No   OB History    Gravida Para Term Preterm AB Living   1 0 0 0 1 0   SAB TAB Ectopic Multiple Live Births   1 0 0 0       Review of Systems  Constitutional: Negative.   Respiratory: Negative.   Cardiovascular: Negative for chest pain.  Gastrointestinal: Negative.   Musculoskeletal: Negative.   Neurological: Positive for headaches. Negative for dizziness, tremors, syncope, speech difficulty, weakness and numbness.  Hematological: Does not bruise/bleed easily.  Psychiatric/Behavioral: Negative.   All other systems reviewed and are negative.   Allergies  Review of patient's allergies indicates no known allergies.  Home  Medications   Prior to Admission medications   Medication Sig Start Date End Date Taking? Authorizing Provider  ibuprofen (ADVIL,MOTRIN) 200 MG tablet Take 800 mg by mouth 3 (three) times daily.   Yes Historical Provider, MD   Meds Ordered and Administered this Visit  Medications - No data to display  BP (!) 122/52 (BP Location: Left Arm)   Pulse 61   Temp 98 F (36.7 C) (Oral)   Resp 16   LMP 10/22/2016   SpO2 100%  No data found.   Physical Exam  Constitutional: She is oriented to person, place, and time. She appears well-developed and well-nourished. No distress.  HENT:  Right Ear: External ear normal.  Left Ear: External ear normal.  Nose: Nose normal.  Mouth/Throat: Oropharynx is clear and moist. No oropharyngeal exudate.  There is no tenderness to the scalp. No head tenderness. The patient points to the right parietal scalp where she has a mild headache. No evidences of scalp injury. No injury to the scan, abrasion or laceration.  Bilateral TMs are pearly gray, transparent and without hemotympanum, effusion, erythema, drainage or other pathology.  Eyes: EOM are normal. Pupils are equal, round, and reactive to light.  Neck: Normal range of motion. Neck supple.  Cardiovascular: Normal rate, regular rhythm and intact distal pulses.   Pulmonary/Chest: Effort normal and breath sounds normal.  Musculoskeletal: Normal range of motion. She exhibits no edema, tenderness or deformity.  Right lower extremity with normal  range of motion of the hip and knee. Strength is 5 over 5 and symmetric. Right knee with full range of motion. No swelling, deformity or tenderness. Superficial ecchymosis to the right distal anteromedial thigh. Able to stand, bear weight and ambulate without limp or need for assistance. Gait is smooth and balanced.  Lymphadenopathy:    She has no cervical adenopathy.  Neurological: She is alert and oriented to person, place, and time.  Skin: Skin is warm and dry.   Psychiatric: She has a normal mood and affect. Her behavior is normal. Thought content normal.  Nursing note and vitals reviewed.   Urgent Care Course   Clinical Course    Procedures (including critical care time)  Labs Review Labs Reviewed - No data to display  Imaging Review No results found.   Visual Acuity Review  Right Eye Distance:   Left Eye Distance:   Bilateral Distance:    Right Eye Near:   Left Eye Near:    Bilateral Near:         MDM   1. Contusion of right thigh, initial encounter   2. Contusion of scalp, initial encounter   3. Acute post-traumatic headache, not intractable    May apply ice to the right leg and the scalp for the next one or 2 days. Recommend taking Tylenol for discomfort. For now no ibuprofen or Aleve. Read instructions regarding head injury. Reviewed the symptoms in which we discussed. If you have any of the signs for head injury seek medical attention promptly     Janne Napoleon, NP 10/25/16 1955

## 2016-10-25 NOTE — ED Triage Notes (Signed)
Patient states she was at work cleaning the water off the floor, she was bent down, when a machine working next to her malfunctioned and started spinning, the machine then hit her on the left side of head and right inside of knee (brusing noted) patient ambulatory. Patient denies LOC.

## 2016-11-10 IMAGING — MG MM DIGITAL DIAGNOSTIC BILAT
6 series · 6 of 6 positions shown · non-contrast
Comparison: 06/02/2014, 10/18/2013 and 12/02/2011

CLINICAL DATA: Follow-up left breast calcifications

EXAM:
DIGITAL DIAGNOSTIC  BILATERAL MAMMOGRAM WITH CAD

[L MLO]
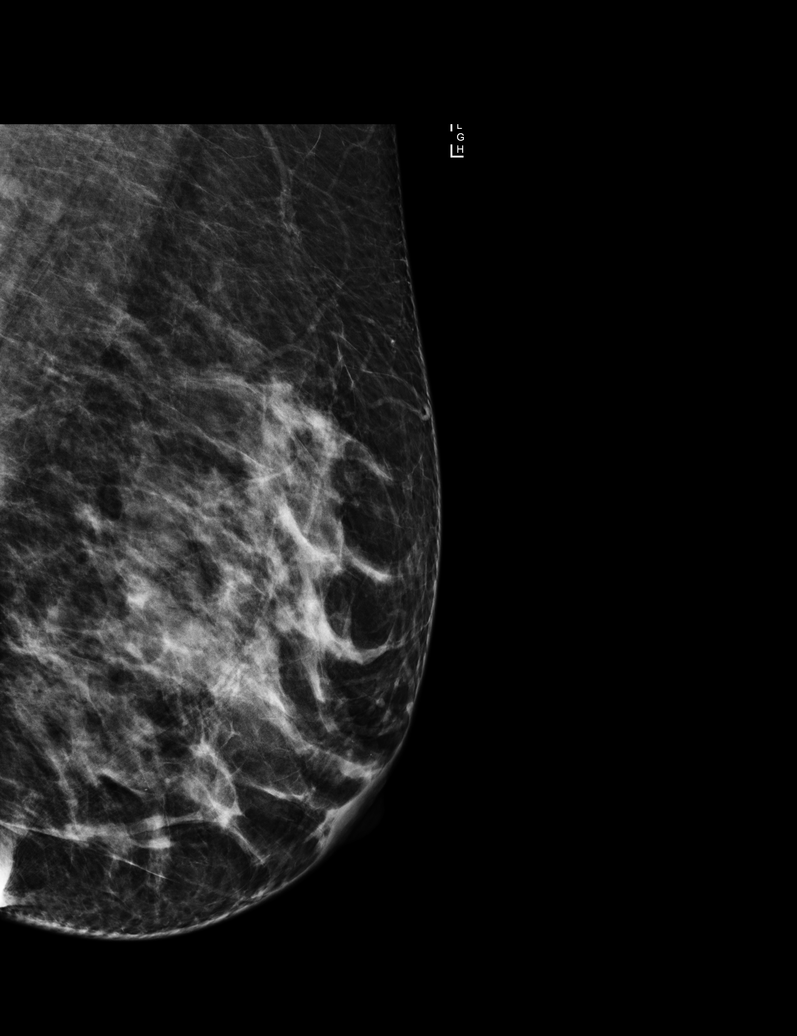

[R MLO]
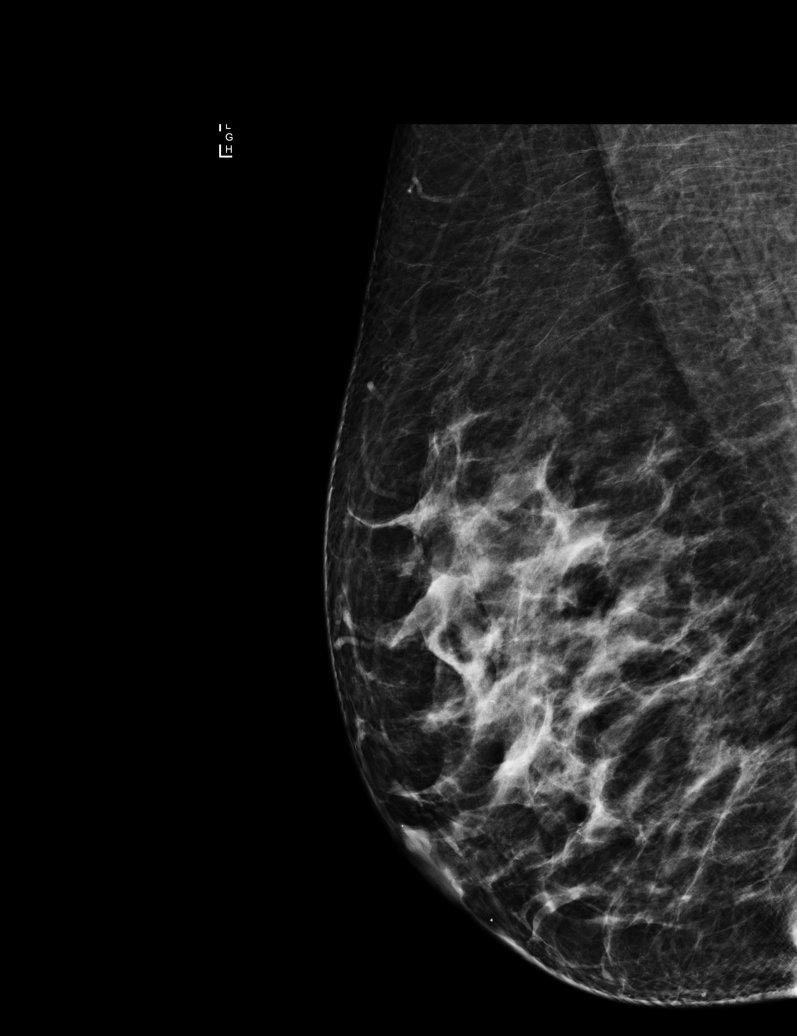

[L ML]
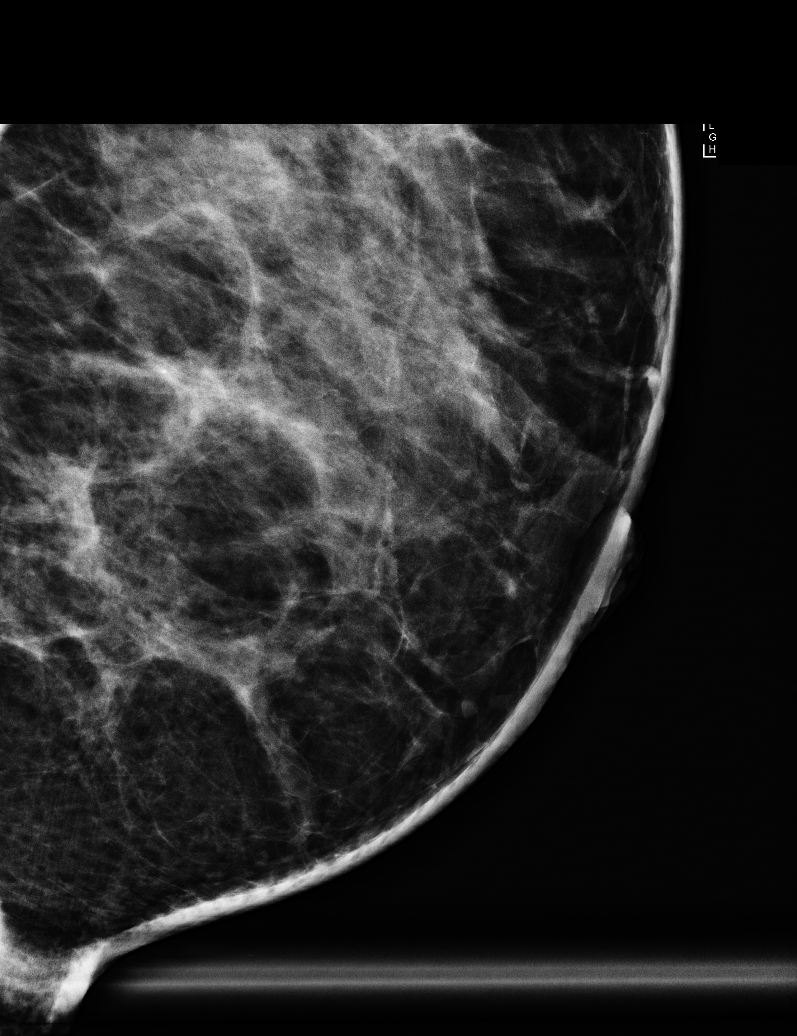

[R CC]
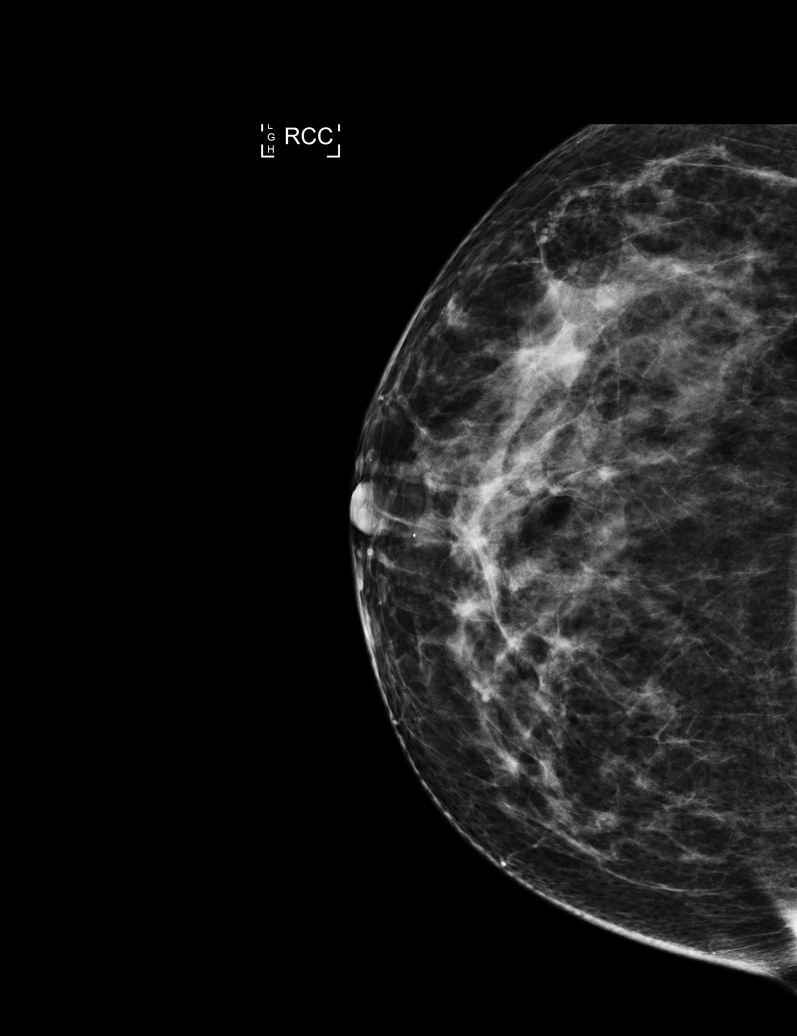

[L CC (1 of 2)]
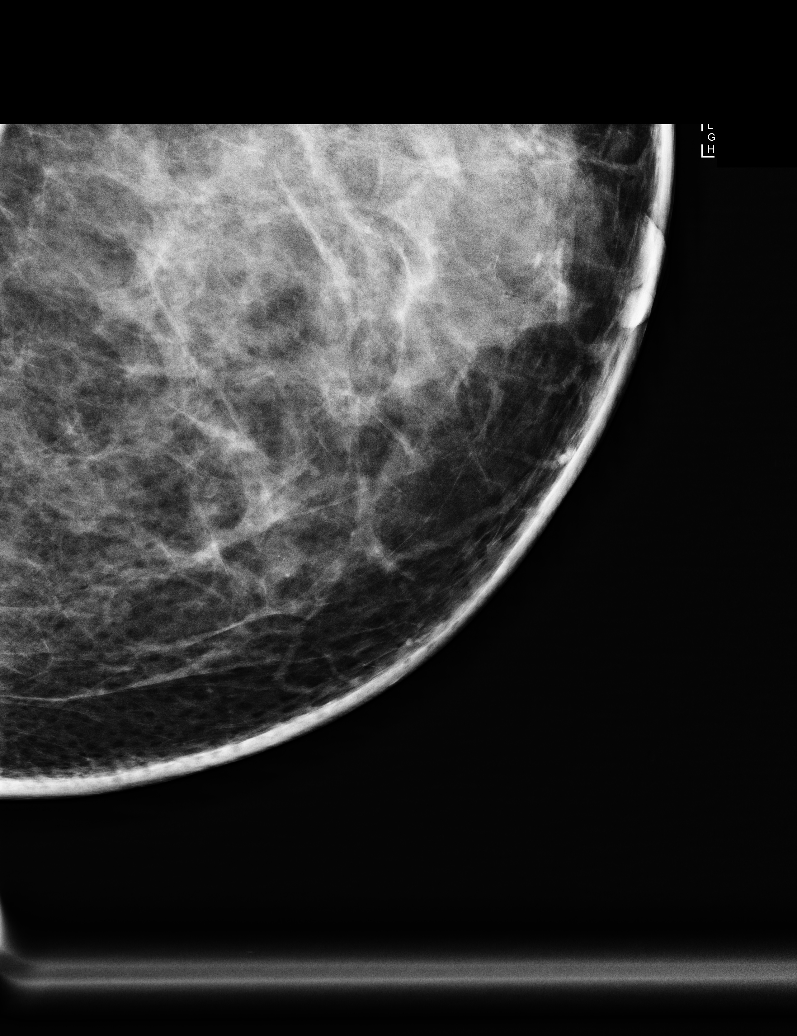

[L CC (2 of 2)]
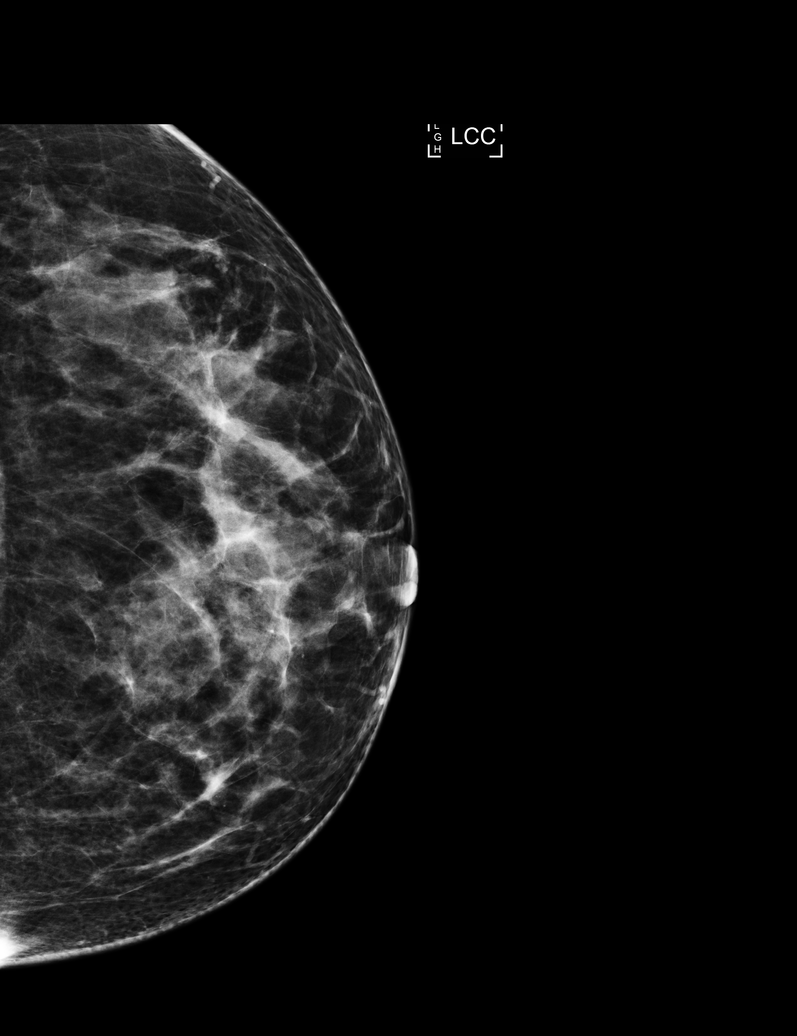

[6 of 6 positions shown; findings below may reference images not displayed]

ACR Breast Density Category c: The breast tissue is heterogeneously
dense, which may obscure small masses.
FINDINGS: The patient returns for additional follow-up of probably benign left
breast calcifications identified in the lower inner quadrant of the
left breast. This small cluster of round calcifications measures
approximately 3 mm in dimension and is unchanged from prior. The
breasts are otherwise without suspicious mass or architectural
distortion.

Mammographic images were processed with CAD.
IMPRESSION: Left breast calcifications are stable and are probably benign. There
is no mammographic evidence of malignancy involving the right
breast.

RECOMMENDATION:
Recommend an additional six-month follow-up diagnostic left breast
mammogram to monitor for continued stability of probably benign left
breast calcifications.

I have discussed the findings and recommendations with the patient.
Results were also provided in writing at the conclusion of the
visit. If applicable, a reminder letter will be sent to the patient
regarding the next appointment.

BI-RADS CATEGORY  3: Probably benign finding(s) - short interval
follow-up suggested.

## 2016-11-27 ENCOUNTER — Ambulatory Visit (HOSPITAL_COMMUNITY): Payer: No Typology Code available for payment source

## 2017-01-01 ENCOUNTER — Other Ambulatory Visit: Payer: Self-pay | Admitting: Obstetrics and Gynecology

## 2017-01-01 DIAGNOSIS — Z1231 Encounter for screening mammogram for malignant neoplasm of breast: Secondary | ICD-10-CM

## 2017-01-13 ENCOUNTER — Encounter (HOSPITAL_COMMUNITY): Payer: Self-pay

## 2017-01-13 ENCOUNTER — Ambulatory Visit (HOSPITAL_COMMUNITY)
Admission: RE | Admit: 2017-01-13 | Discharge: 2017-01-13 | Disposition: A | Discharge status: Home / Self Care | Payer: Self-pay | Source: Ambulatory Visit | Attending: Obstetrics and Gynecology | Admitting: Obstetrics and Gynecology

## 2017-01-13 ENCOUNTER — Ambulatory Visit
Admission: RE | Admit: 2017-01-13 | Discharge: 2017-01-13 | Disposition: A | Discharge status: Home / Self Care | Payer: No Typology Code available for payment source | Source: Ambulatory Visit | Attending: Obstetrics and Gynecology | Admitting: Obstetrics and Gynecology

## 2017-01-13 VITALS — BP 102/68 | Temp 98.9°F | Ht 60.0 in | Wt 152.8 lb

## 2017-01-13 DIAGNOSIS — Z01419 Encounter for gynecological examination (general) (routine) without abnormal findings: Secondary | ICD-10-CM

## 2017-01-13 DIAGNOSIS — Z1231 Encounter for screening mammogram for malignant neoplasm of breast: Secondary | ICD-10-CM

## 2017-01-13 NOTE — Progress Notes (Signed)
No complaints today.   Pap Smear: Pap smear completed today. Last Pap smear was 10/18/2013 at [facility] and normal. Per patient has no history of an abnormal Pap smear. Last Pap smear result is in EPIC.  Physical exam: Breasts Breasts symmetrical. No skin abnormalities bilateral breasts. No nipple retraction bilateral breasts. No nipple discharge bilateral breasts. No lymphadenopathy. No lumps palpated bilateral breasts. No complaints of pain or tenderness on exam. Referred patient to the [facility] for a screening mammogram. Appointment scheduled for Tuesday, January 13, 2017 at 1240.   Pelvic/Bimanual   Ext Genitalia No lesions, no swelling and no discharge observed on external genitalia.         Vagina Vagina pink and normal texture. No lesions or discharge observed in vagina.          Cervix Cervix is present. Cervix pink and of normal texture. No discharge observed.     Uterus Uterus is present and palpable. Uterus in normal position and normal size.        Adnexae Bilateral ovaries present and palpable. No tenderness on palpation.          Rectovaginal No rectal exam completed today since patient had no rectal complaints. No skin abnormalities observed on exam.    Smoking History: Patient has never smoked.  Patient Navigation: Patient education provided. Access to services provided for patient through [facility] program. Spanish interpreter provided.  Used Spanish interpreter [employer] from [place].

## 2017-01-13 NOTE — Patient Instructions (Signed)
Explained breast self awareness with Zula Angel-Garcia. Let patient know BCCCP will cover Pap smears and HPV typing every 5 years unless has a history of abnormal Pap smears. Referred patient to the [facility] for a screening mammogram. Appointment scheduled for Tuesday, January 13, 2017 at 1240. Let patient know will follow up with her within the next couple weeks with results of Pap smear by phone. Informed patient that the [facility] Center will follow up with her within the next couple of weeks with results of mammogram by letter or phone. Kaysen Angel-Garcia verbalized understanding.  Lot Medford, [name], RN 1:14 PM

## 2017-01-14 ENCOUNTER — Encounter (HOSPITAL_COMMUNITY): Payer: Self-pay | Admitting: *Deleted

## 2017-01-15 LAB — CYTOLOGY - PAP
Diagnosis: NEGATIVE
HPV: NOT DETECTED

## 2017-01-16 ENCOUNTER — Encounter (HOSPITAL_COMMUNITY): Payer: Self-pay | Admitting: *Deleted

## 2017-01-16 ENCOUNTER — Telehealth (HOSPITAL_COMMUNITY): Payer: Self-pay | Admitting: *Deleted

## 2017-01-16 NOTE — Telephone Encounter (Signed)
Telephoned patient at home number and discussed negative pap smear results. HPV was negative. Next pap smear due in five years. Patient voiced understanding.

## 2017-09-28 ENCOUNTER — Encounter (HOSPITAL_COMMUNITY): Payer: Self-pay

## 2017-10-27 ENCOUNTER — Encounter (HOSPITAL_COMMUNITY): Payer: Self-pay

## 2017-12-07 ENCOUNTER — Other Ambulatory Visit: Payer: Self-pay | Admitting: Obstetrics and Gynecology

## 2017-12-07 DIAGNOSIS — Z1231 Encounter for screening mammogram for malignant neoplasm of breast: Secondary | ICD-10-CM

## 2017-12-10 ENCOUNTER — Ambulatory Visit: Payer: Self-pay | Admitting: Internal Medicine

## 2018-01-11 ENCOUNTER — Ambulatory Visit: Payer: Self-pay | Admitting: Internal Medicine

## 2018-01-21 ENCOUNTER — Ambulatory Visit
Admission: RE | Admit: 2018-01-21 | Discharge: 2018-01-21 | Disposition: A | Discharge status: Home / Self Care | Payer: Self-pay | Source: Ambulatory Visit | Attending: Obstetrics and Gynecology | Admitting: Obstetrics and Gynecology

## 2018-01-21 ENCOUNTER — Encounter (HOSPITAL_COMMUNITY): Payer: Self-pay

## 2018-01-21 ENCOUNTER — Ambulatory Visit (HOSPITAL_COMMUNITY)
Admission: RE | Admit: 2018-01-21 | Discharge: 2018-01-21 | Disposition: A | Discharge status: Home / Self Care | Payer: Self-pay | Source: Ambulatory Visit | Attending: Obstetrics and Gynecology | Admitting: Obstetrics and Gynecology

## 2018-01-21 VITALS — BP 102/60 | Wt 151.8 lb

## 2018-01-21 DIAGNOSIS — Z1239 Encounter for other screening for malignant neoplasm of breast: Secondary | ICD-10-CM

## 2018-01-21 DIAGNOSIS — Z1231 Encounter for screening mammogram for malignant neoplasm of breast: Secondary | ICD-10-CM

## 2018-01-21 NOTE — Progress Notes (Addendum)
Patient complained that she noticed a bilateral spontaneous milky breast discharge once or twice within the past year.  Pap Smear: Pap smear not completed today. Last Pap smear was 01/13/2017 at [facility] and normal with negative HPV. Per patient has no history of an abnormal Pap smear. Last two Pap smear results are in EPIC.  Physical exam: Breasts Breasts symmetrical. No skin abnormalities bilateral breasts. No nipple retraction bilateral breasts. No nipple discharge bilateral breasts. Unable to express any breast discharge on exam. No lymphadenopathy. No lumps palpated bilateral breasts. No complaints of pain or tenderness on exam. Referred patient to the [facility] for a screening mammogram. Appointment scheduled for Thursday, January 21, 2018 at 1540. Screening mammogram recommended per Dr. Radford Pax.   Pelvic/Bimanual No Pap smear completed today since last Pap smear and HPV typing was 01/13/2017. Pap smear not indicated per BCCCP guidelines.   Smoking History: Patient has never smoked.  Patient Navigation: Patient education provided. Access to services provided for patient through [facility] program. Patient refused Spanish interpreter.

## 2018-01-21 NOTE — Patient Instructions (Signed)
Explained breast self awareness with Ashley Meyers Patient did not need a Pap smear today due to last Pap smear and HPV typing was 01/13/2017. Let patient know BCCCP will cover Pap smears and HPV typing every 5 years unless has a history of abnormal Pap smears. Referred patient to the [facility] for a screening mammogram. Appointment scheduled for Thursday, January 21, 2018 at 1540. Informed patient that the Breast Center will follow up with her within the next couple of weeks with results of mammogram by letter or phone. Ashley Meyers verbalized understanding.  Shermon Bozzi, [name], RN 4:42 PM

## 2018-02-02 ENCOUNTER — Ambulatory Visit: Payer: Self-pay | Admitting: Internal Medicine

## 2018-02-03 ENCOUNTER — Ambulatory Visit: Payer: Self-pay | Admitting: Internal Medicine

## 2018-02-03 ENCOUNTER — Encounter: Payer: Self-pay | Admitting: Internal Medicine

## 2018-02-03 VITALS — BP 112/82 | HR 60 | Resp 12 | Ht <= 58 in | Wt 147.0 lb

## 2018-02-03 DIAGNOSIS — Z599 Problem related to housing and economic circumstances, unspecified: Secondary | ICD-10-CM

## 2018-02-03 DIAGNOSIS — L989 Disorder of the skin and subcutaneous tissue, unspecified: Secondary | ICD-10-CM

## 2018-02-03 DIAGNOSIS — F439 Reaction to severe stress, unspecified: Secondary | ICD-10-CM

## 2018-02-03 DIAGNOSIS — H029 Unspecified disorder of eyelid: Secondary | ICD-10-CM

## 2018-02-03 DIAGNOSIS — G44209 Tension-type headache, unspecified, not intractable: Secondary | ICD-10-CM

## 2018-02-03 NOTE — Progress Notes (Signed)
SW Intern met with Ashley Meyers to implement new patient screening and assess for any behavioral health or resource needs. Ashley Meyers noted that she has had some struggles in the past with suicidal ideation but is not experiencing many current mental health discrepancies. She described that her faith is keeping her hopeful and giving her strength. She disclosed some concerns over her current housing situation and feeling unsafe. She stated that this is the first time she has not lived with family and this has been a difficult adjustment. She disclosed that there has been resource needs in the past but she does not have any current concerns over food, transportation, or any interpersonal violence.  She did disclose that there has been conflict at her current living situation. SW Intern left contact info and gave information about counseling service. Ashley Meyers was interested in setting this up with a fluent Spanish speaker. A Spanish speaker member of the social work team will follow up with her.

## 2018-02-03 NOTE — Progress Notes (Signed)
Subjective:    Patient ID: Ashley Meyers, female    DOB: 1971-07-29, 47 y.o.   MRN: [id number]  HPI   Reestablishing. Difficult historian  1.  Headaches:  Points to nuchal area and parietal/temoporal areas bilaterally.  Not clear, but states perhaps in December and January, over a 2-3 week period was having headaches on and off. Then states had headache 2 days ago.  She has difficulty describing the pain, but perhaps pounding in nature. States had an accident at work, she thinks in September 2017; was wearing a safety helmet, and struck on left side--parietal area of head with a metal extension of some sort of machine that was being used to cut a hole in the cement floor. It did not break the helmet and she moved away as she was being hit.  No loss consciousness.  No significant head pain at the time of the injury.  She did not start having the headaches she complains of until 1 year later or more. Headache can last 10 minutes to 3 days.   No aura.  No photophobia or phonophobia.  No nausea or vomiting. Has not tried any medications OTC for the headaches She has had stress in recent times.  Problems finding employment Some negative interactions with roommates.  Some conflicts to the point she stays away from home.  She is actually renting a room from the couple that owns the house. Going to church helps her.  She would like to find different housing.  2.  Possible Mold exposure:  7-8 months ago, lived for 3 months in another household.  Was sleeping on the floor. All of her belongings were on the floor.  Her belongings and clothing smelled also.  She could smell mold or mildew, but could not see any discoloration, but the room was dark as something dark was over the windows.  Walls were dark as well. Had blisters in a cluster on her right shoulder that were very painful and lasted about a month from when she noted to when they opened, drained and healed.   No definite respiratory  symptoms during this time.  She is no longer living there.  No outpatient medications have been marked as taking for the 02/03/18 encounter (Office Visit) with Mack Hook, MD.   No Known Allergies Review of Systems     Objective:   Physical Exam   HEENT: PERRL, EOMI, Discs sharp, still with white hard pustular looking lesion at eyelash margin of right upper lid.  See visits from some time ago TMs pearly gray, throat without injection Neck:  Supple, No adenopathy or thyromegaly Tender over bilateral traps to shoulder and along cervical paraspinous musculature to nuchal ridge. Chest:  CTA CV: RRR without murmur or rub.  Radial and DP pulses normal and equal. Skin:  Cluster of hyperpigmented 4 mm circular lesions on right shoulder Neuro:  A & O x 3, CN  II-XII grossly intact, DTRs 2+/4 throughout, Motor 5/5 throughout, sensory grossly normal.  Normal gait.      Assessment & Plan:  1.  Muscle Tension Headaches Address stress with counseling:  One of our Spanish speaking counselors Went over heating pad application at bedtime for 20 minutes followed by neck stretches.  2.  Shoulder lesions:  Possibly scars left from shingles outbreak vs. Insect bites of some sort.  3.  Mold exposure:  No current findings to suggest long term effects.  4.  Health Maintenance:  Tdap next visit.  5.  Eyelid lesion: once has orange card, will refer to optometry.  5.  Living concerns:  She will contemplate [facility] referral.

## 2018-02-03 NOTE — Patient Instructions (Addendum)
Ibuprofen 200 mg 2-4 pastillas cada 6 horas a necesita para dolor de [place].  Siempre con comida  Heating pad at bedtime while reading for 20 minutes followed by neck stretches

## 2018-02-10 ENCOUNTER — Other Ambulatory Visit: Payer: Self-pay | Admitting: Licensed Clinical Social Worker

## 2018-03-16 ENCOUNTER — Encounter: Payer: Self-pay | Admitting: Pediatric Intensive Care

## 2018-03-16 DIAGNOSIS — R51 Headache: Principal | ICD-10-CM

## 2018-03-16 DIAGNOSIS — R519 Headache, unspecified: Secondary | ICD-10-CM

## 2018-03-16 LAB — GLUCOSE, POCT (MANUAL RESULT ENTRY): POC Glucose: 96 mg/dl (ref 70–99)

## 2018-03-29 NOTE — Congregational Nurse Program (Signed)
Congregational Nurse Program Note  Date of Encounter: 03/16/2018  Past Medical History: Past Medical History:  Diagnosis Date  . Allergy   . Anxiety   . Depression   . GERD (gastroesophageal reflux disease)     Encounter Details: CNP Questionnaire - 03/16/18 1600      Questionnaire   Patient Status  Immigrant    Race  Hispanic or Latino    Location Patient Served At  [employer]  Not Applicable    Uninsured  Uninsured (NEW 1x/quarter)    Food  No food insecurities    Housing/Utilities  Yes, have permanent housing    Transportation  No transportation needs    Interpersonal Safety  Yes, feel physically and emotionally safe where you currently live    Medication  No medication insecurities    Medical Provider  Yes    Referrals  Primary Care Provider/Clinic    ED Visit Averted  Not Applicable    Life-Saving Intervention Made  Not Applicable      Via interpreter Juliann Pulse- client states she is a patient at [facility]. She has an appointment in May. She was told that she is "pre-diabetic". Requests BG check. Client states that she has been having pain behind right breast and pain in right thigh which feels "like a nail going in". CN advised speaking to her provider regarding pain issues. Follow up in CN clinic as needed.

## 2018-05-03 ENCOUNTER — Ambulatory Visit: Payer: Self-pay | Admitting: Internal Medicine

## 2018-05-04 ENCOUNTER — Ambulatory Visit: Payer: Self-pay | Admitting: Internal Medicine

## 2018-05-04 ENCOUNTER — Encounter: Payer: Self-pay | Admitting: Internal Medicine

## 2018-05-04 VITALS — BP 122/78 | HR 60 | Resp 12 | Ht <= 58 in | Wt 145.0 lb

## 2018-05-04 DIAGNOSIS — M25512 Pain in left shoulder: Secondary | ICD-10-CM

## 2018-05-04 DIAGNOSIS — H029 Unspecified disorder of eyelid: Secondary | ICD-10-CM

## 2018-05-04 DIAGNOSIS — F439 Reaction to severe stress, unspecified: Secondary | ICD-10-CM

## 2018-05-04 DIAGNOSIS — G44209 Tension-type headache, unspecified, not intractable: Secondary | ICD-10-CM

## 2018-05-04 DIAGNOSIS — E781 Pure hyperglyceridemia: Secondary | ICD-10-CM

## 2018-05-04 DIAGNOSIS — Z87898 Personal history of other specified conditions: Secondary | ICD-10-CM

## 2018-05-04 DIAGNOSIS — Z599 Problem related to housing and economic circumstances, unspecified: Secondary | ICD-10-CM

## 2018-05-04 NOTE — Progress Notes (Signed)
Subjective:    Patient ID: Ashley Meyers, female    DOB: 09-16-1971, 47 y.o.   MRN: [id number]  HPI   1.  Left upper eyelid lesion:  Still there--appears to be a plugged mucous gland.  She is not ready for ophthalmology referral to [facility].    2.  Tension Headaches:  Never met with Metta Clines, LCSW.  Was initially evaluated by Wynn Banker, SW intern.  Patient apparently wanted to continue with [facility], but not clear from her perception. Her headaches are much better, however.  3.  Left anterior shoulder pain that radiated down left anterior axillary area to just under left rib area.  Hard to breathe.  Lasted 15 minutes. Was 1 month ago. 3-4 days ago, had very light discomfort only in left anterior shoulder again.  No radiation.  Lasted seconds to a minute.   She thinks she was pushing heavy boxes up over her head.  She has to hold the box up on the shelf for a few minutes above her head while she uses her other hand to grab things out of another box below the shelf. Both episodes occurred after she was doing the above repeatedly with her job at a [employer]. Did not have any medication either time to try Ibuprofen.  4.  History of prediabetes and elevated triglycerides:  She is fasting this morning.  5.  Housing issues:  Still renting a room and feels uncomfortable in her housing situation.  Pressure to move frequently.  The environment is free of mold and roaches.  Previous problem in another home with moisture and mold.  Current Meds  Medication Sig  . ibuprofen (ADVIL,MOTRIN) 200 MG tablet Take 800 mg by mouth 3 (three) times daily.    No Known Allergies  Review of Systems     Objective:   Physical Exam NAD HEENT: Left upper nasal eyelid with small white pustular lesion without surrounding erythema at lash margin. Neck:  Supple, No adenopathy, no thyromegaly Chest:  CTA CV:  RRR without murmur or rub, radial and DP pulses normal and equal. Compression stockings on  bilateral legs, no edema.   MS:  NT on palpation of anterior shoulder.  Full ROM without pain.       Assessment & Plan:  1.  Tension Headaches:  Seem to have subsided.  Sounds like still a fair amount of stress due to poor income and housing difficulties, however.  2.  Housing issues/Stress:  Pamphlet on support services given and showed her the section on housing support.  Would also like for her to see Macie Burows, LCSW.  Despite her good English, suspect subtle difficulties with language difference and would benefit working with someone speaking fluent Spanish.  Also feel she may have dysthymia.  3.  Left anterior shoulder pain:  Sounds musculoskeletal and related to work.  To take Ibuprofen for these episodes or to call if progresses/recurs.  4.  What appears to be a plugged mucous gland of lash margin, left upper eyelid.  To try gently washing twice daily with clean cloth dipped in warm water and a couple of drops of Johnson's Baby Shampoo to see if can release plug. Otherwise, to look at [facility] at [facility] and let us know if interested in pursuing Ophthalmology referral there for this.  5.  History of prediabetes with A1C of 6.1% in 2017 and elevated triglycerides with otherwise good cholesterol panel.  A1C, CBC, CMP, FLP today.  Follow up in 4-6  months to address HM issues:  No pap until 2021 and Mammogram done 12/2017.

## 2018-05-04 NOTE — Patient Instructions (Addendum)
Can go online to [facility] at [facility] to see how it works for the possibility of being seen there for your eye lesion.

## 2018-05-05 LAB — COMPREHENSIVE METABOLIC PANEL
ALT: 7 IU/L (ref 0–32)
AST: 17 IU/L (ref 0–40)
Albumin/Globulin Ratio: 1.4 (ref 1.2–2.2)
Albumin: 3.7 g/dL (ref 3.5–5.5)
Alkaline Phosphatase: 69 IU/L (ref 39–117)
BUN/Creatinine Ratio: 17 (ref 9–23)
BUN: 11 mg/dL (ref 6–24)
Bilirubin Total: <0.2 mg/dL (ref 0.0–1.2)
CO2: 24 mmol/L (ref 20–29)
Calcium: 8.3 mg/dL — ABNORMAL LOW (ref 8.7–10.2)
Chloride: 106 mmol/L (ref 96–106)
Creatinine, Ser: 0.65 mg/dL (ref 0.57–1.00)
GFR calc Af Amer: 121 mL/min/{1.73_m2} (ref >59)
GFR calc non Af Amer: 105 mL/min/{1.73_m2} (ref >59)
Globulin, Total: 2.7 g/dL (ref 1.5–4.5)
Glucose: 92 mg/dL (ref 65–99)
Potassium: 4 mmol/L (ref 3.5–5.2)
Sodium: 143 mmol/L (ref 134–144)
Total Protein: 6.4 g/dL (ref 6.0–8.5)

## 2018-05-05 LAB — CBC WITH DIFFERENTIAL/PLATELET
Basophils Absolute: 0.1 10*3/uL (ref 0.0–0.2)
Basos: 1 %
EOS (ABSOLUTE): 0.4 10*3/uL (ref 0.0–0.4)
Eos: 7 %
Hematocrit: 34.6 % (ref 34.0–46.6)
Hemoglobin: 10.7 g/dL — ABNORMAL LOW (ref 11.1–15.9)
Immature Grans (Abs): 0 10*3/uL (ref 0.0–0.1)
Immature Granulocytes: 0 %
Lymphocytes Absolute: 1.6 10*3/uL (ref 0.7–3.1)
Lymphs: 28 %
MCH: 25.2 pg — ABNORMAL LOW (ref 26.6–33.0)
MCHC: 30.9 g/dL — ABNORMAL LOW (ref 31.5–35.7)
MCV: 82 fL (ref 79–97)
Monocytes Absolute: 0.4 10*3/uL (ref 0.1–0.9)
Monocytes: 7 %
Neutrophils Absolute: 3.1 10*3/uL (ref 1.4–7.0)
Neutrophils: 57 %
Platelets: 307 10*3/uL (ref 150–379)
RBC: 4.24 x10E6/uL (ref 3.77–5.28)
RDW: 16.7 % — ABNORMAL HIGH (ref 12.3–15.4)
WBC: 5.5 10*3/uL (ref 3.4–10.8)

## 2018-05-05 LAB — HGB A1C W/O EAG: Hgb A1c MFr Bld: 5.7 % — ABNORMAL HIGH (ref 4.8–5.6)

## 2018-05-05 LAB — LIPID PANEL W/O CHOL/HDL RATIO
Cholesterol, Total: 186 mg/dL (ref 100–199)
HDL: 69 mg/dL (ref >39)
LDL Calculated: 96 mg/dL (ref 0–99)
Triglycerides: 106 mg/dL (ref 0–149)
VLDL Cholesterol Cal: 21 mg/dL (ref 5–40)

## 2018-05-27 LAB — IRON AND TIBC
Iron Saturation: 8 % — CL (ref 15–55)
Iron: 28 ug/dL (ref 27–159)
Total Iron Binding Capacity: 351 ug/dL (ref 250–450)
UIBC: 323 ug/dL (ref 131–425)

## 2018-05-27 LAB — FOLATE: Folate: 12.1 ng/mL (ref >3.0)

## 2018-05-27 LAB — VITAMIN B12: Vitamin B-12: 387 pg/mL (ref 232–1245)

## 2018-05-27 LAB — SPECIMEN STATUS REPORT

## 2018-07-01 ENCOUNTER — Telehealth: Payer: Self-pay | Admitting: Licensed Clinical Social Worker

## 2018-07-01 NOTE — Telephone Encounter (Signed)
LCSW followed up with patient following her phone call yesterday, 7/10. Patient had reported that she was fearful of being homeless after a conflict with her roommates. 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 LCSW also shared the number for [facility]; [place] at [facility] confirmed that there would be a bed for patient if she called in the next few days. LCSW encouraged patient to call with any questions or concerns.

## 2018-07-13 ENCOUNTER — Other Ambulatory Visit: Payer: Self-pay

## 2018-08-12 ENCOUNTER — Other Ambulatory Visit: Payer: Self-pay

## 2018-08-12 DIAGNOSIS — D509 Iron deficiency anemia, unspecified: Secondary | ICD-10-CM

## 2018-08-13 LAB — CBC WITH DIFFERENTIAL/PLATELET
Basophils Absolute: 0.1 10*3/uL (ref 0.0–0.2)
Basos: 1 %
EOS (ABSOLUTE): 0.4 10*3/uL (ref 0.0–0.4)
Eos: 6 %
Hematocrit: 30.9 % — ABNORMAL LOW (ref 34.0–46.6)
Hemoglobin: 10.2 g/dL — ABNORMAL LOW (ref 11.1–15.9)
Immature Grans (Abs): 0 10*3/uL (ref 0.0–0.1)
Immature Granulocytes: 0 %
Lymphocytes Absolute: 1.8 10*3/uL (ref 0.7–3.1)
Lymphs: 31 %
MCH: 26.1 pg — ABNORMAL LOW (ref 26.6–33.0)
MCHC: 33 g/dL (ref 31.5–35.7)
MCV: 79 fL (ref 79–97)
Monocytes Absolute: 0.4 10*3/uL (ref 0.1–0.9)
Monocytes: 6 %
Neutrophils Absolute: 3.4 10*3/uL (ref 1.4–7.0)
Neutrophils: 56 %
Platelets: 276 10*3/uL (ref 150–450)
RBC: 3.91 x10E6/uL (ref 3.77–5.28)
RDW: 16.5 % — ABNORMAL HIGH (ref 12.3–15.4)
WBC: 5.9 10*3/uL (ref 3.4–10.8)

## 2018-09-10 ENCOUNTER — Encounter: Payer: Self-pay | Admitting: Internal Medicine

## 2018-10-21 ENCOUNTER — Ambulatory Visit: Payer: Self-pay | Admitting: Internal Medicine

## 2019-01-28 ENCOUNTER — Ambulatory Visit: Payer: Self-pay | Admitting: Internal Medicine

## 2020-01-12 ENCOUNTER — Telehealth: Payer: Self-pay | Admitting: Internal Medicine

## 2020-02-01 ENCOUNTER — Other Ambulatory Visit (HOSPITAL_COMMUNITY): Payer: Self-pay

## 2020-02-01 DIAGNOSIS — Z1231 Encounter for screening mammogram for malignant neoplasm of breast: Secondary | ICD-10-CM

## 2020-02-23 ENCOUNTER — Ambulatory Visit: Payer: No Typology Code available for payment source

## 2020-03-06 ENCOUNTER — Ambulatory Visit: Payer: Self-pay | Admitting: Internal Medicine

## 2020-03-08 ENCOUNTER — Ambulatory Visit: Payer: Self-pay | Admitting: Women's Health

## 2020-03-08 ENCOUNTER — Ambulatory Visit: Payer: No Typology Code available for payment source

## 2020-03-08 ENCOUNTER — Other Ambulatory Visit: Payer: Self-pay

## 2020-03-08 VITALS — BP 106/72 | Temp 97.0°F | Wt 138.0 lb

## 2020-03-08 DIAGNOSIS — Z1239 Encounter for other screening for malignant neoplasm of breast: Secondary | ICD-10-CM

## 2020-03-08 NOTE — Progress Notes (Signed)
Ms. Ashley Meyers is a 49 y.o. female who presents to [facility] clinic today with no complaints.    Pap Smear: Pap not smear completed today. Last Pap smear was 01/13/2017 at [facility] clinic and was normal. Per patient has no history of an abnormal Pap smear. Last Pap smear result is available in Epic.   Physical exam: Breasts Breasts symmetrical. No skin abnormalities bilateral breasts. No nipple retraction bilateral breasts. No nipple discharge bilateral breasts. No lymphadenopathy. No lumps palpated bilateral breasts.       Pelvic/Bimanual Pap is not indicated today    Smoking History: Patient has never smoked not referred to quit line.    Patient Navigation: Patient education provided. Access to services provided for patient through [facility] program. No interpreter provided. No transportation provided   Colorectal Cancer Screening: Per patient has never had colonoscopy completed No complaints today.    Breast and Cervical Cancer Risk Assessment: Patient does not have family history of breast cancer, known genetic mutations, or radiation treatment to the chest before age 51. Patient does not have history of cervical dysplasia, immunocompromised, or DES exposure in-utero.  Risk Assessment    Risk Scores      03/08/2020   Last edited by: Demetrius Revel, LPN   5-year risk: 0.8 %   Lifetime risk: 7 %          A: BCCCP exam without pap smear Complaint of none.  P: Referred patient to the [facility] for a screening mammogram. Appointment scheduled 03/13/2020.  Clarisa Fling, NP 03/08/2020 3:03 PM

## 2020-03-08 NOTE — Patient Instructions (Signed)
Breast Self-Awareness Breast self-awareness means being familiar with how your breasts look and feel. It involves checking your breasts regularly and reporting any changes to your health care provider. Practicing breast self-awareness is important. Sometimes changes may not be harmful (are benign), but sometimes a change in your breasts can be a sign of a serious medical problem. It is important to learn how to do this procedure correctly so that you can catch problems early, when treatment is more likely to be successful. All women should practice breast self-awareness, including women who have had breast implants. What you need:  A mirror.  A well-lit room. How to do a breast self-exam A breast self-exam is one way to learn what is normal for your breasts and whether your breasts are changing. To do a breast self-exam: Look for changes  1. Remove all the clothing above your waist. 2. Stand in front of a mirror in a room with good lighting. 3. Put your hands on your hips. 4. Push your hands firmly downward. 5. Compare your breasts in the mirror. Look for differences between them (asymmetry), such as: ? Differences in shape. ? Differences in size. ? Puckers, dips, and bumps in one breast and not the other. 6. Look at each breast for changes in the skin, such as: ? Redness. ? Scaly areas. 7. Look for changes in your nipples, such as: ? Discharge. ? Bleeding. ? Dimpling. ? Redness. ? A change in position. Feel for changes Carefully feel your breasts for lumps and changes. It is best to do this while lying on your back on the floor, and again while sitting or standing in the tub or shower with soapy water on your skin. Feel each breast in the following way: 1. Place the arm on the side of the breast you are examining above your head. 2. Feel your breast with the other hand. 3. Start in the nipple area and make -inch (2 cm) overlapping circles to feel your breast. Use the pads of your  three middle fingers to do this. Apply light pressure, then medium pressure, then firm pressure. The light pressure will allow you to feel the tissue closest to the skin. The medium pressure will allow you to feel the tissue that is a little deeper. The firm pressure will allow you to feel the tissue close to the ribs. 4. Continue the overlapping circles, moving downward over the breast until you feel your ribs below your breast. 5. Move one finger-width toward the center of the body. Continue to use the -inch (2 cm) overlapping circles to feel your breast as you move slowly up toward your collarbone. 6. Continue the up-and-down exam using all three pressures until you reach your armpit.  Write down what you find Writing down what you find can help you remember what to discuss with your health care provider. Write down:  What is normal for each breast.  Any changes that you find in each breast, including: ? The kind of changes you find. ? Any pain or tenderness. ? Size and location of any lumps.  Where you are in your menstrual cycle, if you are still menstruating. General tips and recommendations  Examine your breasts every month.  If you are breastfeeding, the best time to examine your breasts is after a feeding or after using a breast pump.  If you menstruate, the best time to examine your breasts is 5-7 days after your period. Breasts are generally lumpier during menstrual periods, and it may  be more difficult to notice changes.  With time and practice, you will become more familiar with the variations in your breasts and more comfortable with the exam. Contact a health care provider if you:  See a change in the shape or size of your breasts or nipples.  See a change in the skin of your breast or nipples, such as a reddened or scaly area.  Have unusual discharge from your nipples.  Find a lump or thick area that was not there before.  Have pain in your breasts.  Have any  concerns related to your breast health. Summary  Breast self-awareness includes looking for physical changes in your breasts, as well as feeling for any changes within your breasts.  Breast self-awareness should be performed in front of a mirror in a well-lit room.  You should examine your breasts every month. If you menstruate, the best time to examine your breasts is 5-7 days after your menstrual period.  Let your health care provider know of any changes you notice in your breasts, including changes in size, changes on the skin, pain or tenderness, or unusual fluid from your nipples. This information is not intended to replace advice given to you by your health care provider. Make sure you discuss any questions you have with your health care provider. Document Revised: 07/27/2018 Document Reviewed: 07/27/2018 Elsevier Patient Education  2020 Elsevier Inc.  

## 2020-03-28 ENCOUNTER — Telehealth: Payer: Self-pay

## 2020-03-28 NOTE — Telephone Encounter (Signed)
Returned patient's call regarding appointment at [facility]. Left message on voicemail requesting patient to return call to office.

## 2020-03-28 NOTE — Telephone Encounter (Signed)
Patient returned my call and states she missed her mammogram appointment, was not sure when it was scheduled. Patient was informed that since she has been seen in the [facility] clinic, she has her [place], and call the [facility] to reschedule the mammogram. Patient verbalized understanding.

## 2020-04-12 ENCOUNTER — Encounter: Payer: Self-pay | Admitting: Internal Medicine

## 2020-04-12 ENCOUNTER — Other Ambulatory Visit: Payer: Self-pay

## 2020-04-12 ENCOUNTER — Ambulatory Visit: Payer: Self-pay | Admitting: Internal Medicine

## 2020-04-12 VITALS — BP 102/70 | HR 64 | Resp 12 | Ht <= 58 in | Wt 135.0 lb

## 2020-04-12 DIAGNOSIS — Z87898 Personal history of other specified conditions: Secondary | ICD-10-CM

## 2020-04-12 DIAGNOSIS — M545 Low back pain, unspecified: Secondary | ICD-10-CM

## 2020-04-12 DIAGNOSIS — Z1231 Encounter for screening mammogram for malignant neoplasm of breast: Secondary | ICD-10-CM

## 2020-04-12 DIAGNOSIS — E663 Overweight: Secondary | ICD-10-CM | POA: Insufficient documentation

## 2020-04-12 DIAGNOSIS — E781 Pure hyperglyceridemia: Secondary | ICD-10-CM

## 2020-04-12 DIAGNOSIS — R634 Abnormal weight loss: Secondary | ICD-10-CM

## 2020-04-12 DIAGNOSIS — G8929 Other chronic pain: Secondary | ICD-10-CM

## 2020-04-12 DIAGNOSIS — D509 Iron deficiency anemia, unspecified: Secondary | ICD-10-CM

## 2020-04-12 MED ORDER — FERROUS GLUCONATE 324 (38 FE) MG PO TABS: ORAL_TABLET | ORAL | 3 refills | Status: DC

## 2020-04-12 NOTE — Patient Instructions (Signed)
Bring in guaiac cards in 2 weeks.

## 2020-04-12 NOTE — Progress Notes (Signed)
    Subjective:    Patient ID: Ashley Meyers, female   DOB: 1971-11-10, 49 y.o.   MRN: [id number]   HPI   Has not been seen for 2 years.  Poor historian  1.  Bilateral low back "pinching"  For over a year.  No history of injury, but does do a lot of lifting.  She feels she does use her legs for lifting.    2.  Weight loss:  Feels she has lost a lot of weight.  Discussed she is down 10 lbs from 2 years ago.   Has changed the way she eats.  She eats less over past couple of years.  She cannot say why she has decreased her eating.   She does walk regularly.   No definite abdominal pain.   No melena or hematochezia. No nausea or vomiting.   No constipation or diarrhea. Having periods, but becoming more infrequent.  Has had a period past 3 months and has been heavier.  LMP was 03/05/2020.  Lasted 7 days --spotting only for the last 3-4 days. May feel light headed at times.   No chest pain, dyspnea.   History of iron deficiency anemia.  Sounds like she did not get iron supplementation.    3.  COVID vaccination:  Is hesitant.  Long discussion regarding getting vaccinated.  4.  Prediabetes:  A1C was 5.7% in 2019   No outpatient medications have been marked as taking for the 04/12/20 encounter (Office Visit) with Mack Hook, MD.   No Known Allergies   Review of Systems    Objective:   BP 102/70 (BP Location: Left Arm, Patient Position: Sitting, Cuff Size: Normal)   Pulse 64   Resp 12   Ht 4\' 9"  (1.448 m)   Wt 135 lb (61.2 kg)   LMP 03/05/2020   BMI 29.21 kg/m   Physical Exam  NAD HEENT:  PERRL, EOMI, palpebral conjunctivae a bit pale Neck:  Supple, No adenopathy, no thyromegaly Chest:  CTA CV:  RRR with normal S1 and S2, No S3, S4 or murmur.  Radial and DP pulses normal and equal Abd:  S, NT, No HSM or mass, + BS LE:  No edema. MS:  Mildly tender over L/S paraspinous musculature.    Assessment & Plan   1.  Weight Loss:  Fairly minimal over a 2 year  time period.  CBC, CMP, TSH, Mammogram, Guaiac Cards x 3 to return in 2 weeks.  2.  History of iron deficiency anemia:  CBC.  Ferrous gluconate 324 mg twice daily with a tangerine.  CBC repeat in 2 months with weight check as well.  3.  Prediabetes:  A1C.  Discussed would like to see weight loss in a healthy fashion to improve this.  4.   Elevated Triglycerides:  FLP.    5.  Low back pain:  PT referral.

## 2020-04-13 LAB — CBC WITH DIFFERENTIAL/PLATELET
Basophils Absolute: 0.1 10*3/uL (ref 0.0–0.2)
Basos: 1 %
EOS (ABSOLUTE): 0.3 10*3/uL (ref 0.0–0.4)
Eos: 6 %
Hematocrit: 33.5 % — ABNORMAL LOW (ref 34.0–46.6)
Hemoglobin: 10.1 g/dL — ABNORMAL LOW (ref 11.1–15.9)
Immature Grans (Abs): 0 10*3/uL (ref 0.0–0.1)
Immature Granulocytes: 0 %
Lymphocytes Absolute: 1.3 10*3/uL (ref 0.7–3.1)
Lymphs: 25 %
MCH: 22.9 pg — ABNORMAL LOW (ref 26.6–33.0)
MCHC: 30.1 g/dL — ABNORMAL LOW (ref 31.5–35.7)
MCV: 76 fL — ABNORMAL LOW (ref 79–97)
Monocytes Absolute: 0.4 10*3/uL (ref 0.1–0.9)
Monocytes: 7 %
Neutrophils Absolute: 3.2 10*3/uL (ref 1.4–7.0)
Neutrophils: 61 %
Platelets: 304 10*3/uL (ref 150–450)
RBC: 4.42 x10E6/uL (ref 3.77–5.28)
RDW: 16.7 % — ABNORMAL HIGH (ref 11.7–15.4)
WBC: 5.3 10*3/uL (ref 3.4–10.8)

## 2020-04-13 LAB — TSH: TSH: 1.02 u[IU]/mL (ref 0.450–4.500)

## 2020-04-13 LAB — HGB A1C W/O EAG: Hgb A1c MFr Bld: 5.9 % — ABNORMAL HIGH (ref 4.8–5.6)

## 2020-04-13 LAB — COMPREHENSIVE METABOLIC PANEL
ALT: 7 IU/L (ref 0–32)
AST: 20 IU/L (ref 0–40)
Albumin/Globulin Ratio: 1.6 (ref 1.2–2.2)
Albumin: 4.3 g/dL (ref 3.8–4.8)
Alkaline Phosphatase: 82 IU/L (ref 39–117)
BUN/Creatinine Ratio: 15 (ref 9–23)
BUN: 10 mg/dL (ref 6–24)
Bilirubin Total: <0.2 mg/dL (ref 0.0–1.2)
CO2: 25 mmol/L (ref 20–29)
Calcium: 8.9 mg/dL (ref 8.7–10.2)
Chloride: 102 mmol/L (ref 96–106)
Creatinine, Ser: 0.68 mg/dL (ref 0.57–1.00)
GFR calc Af Amer: 118 mL/min/{1.73_m2} (ref >59)
GFR calc non Af Amer: 102 mL/min/{1.73_m2} (ref >59)
Globulin, Total: 2.7 g/dL (ref 1.5–4.5)
Glucose: 88 mg/dL (ref 65–99)
Potassium: 3.9 mmol/L (ref 3.5–5.2)
Sodium: 140 mmol/L (ref 134–144)
Total Protein: 7 g/dL (ref 6.0–8.5)

## 2020-04-13 LAB — LIPID PANEL W/O CHOL/HDL RATIO
Cholesterol, Total: 197 mg/dL (ref 100–199)
HDL: 84 mg/dL (ref >39)
LDL Chol Calc (NIH): 98 mg/dL (ref 0–99)
Triglycerides: 82 mg/dL (ref 0–149)
VLDL Cholesterol Cal: 15 mg/dL (ref 5–40)

## 2020-04-16 ENCOUNTER — Telehealth: Payer: Self-pay

## 2020-04-16 NOTE — Telephone Encounter (Signed)
Called patient to remind her to call the [facility] to reschedule her screening mammo appointment that she missed. Informed patient that she would need to call them directly to reschedule and to take her pink card when she goes. Patient voiced understanding.

## 2020-04-18 ENCOUNTER — Telehealth: Payer: Self-pay | Admitting: Clinical

## 2020-04-18 NOTE — Telephone Encounter (Signed)
Patient called LCSW seeking other MH due to cost at this time- LCSW provided to [facility] or [facility]. Patient provided verbal consent to send referral just in case.

## 2020-04-24 ENCOUNTER — Encounter: Payer: Self-pay | Admitting: Clinical

## 2020-04-24 NOTE — Progress Notes (Addendum)
LCSW received note to contact [facility] regard concerns of patient responses with them (yes to feeling like life is not worth living; and yes she wishes she were dead). LCSW contacted [facility] and left message. LCSW contacted patient to check in for safety and assess. Patient shared she is good and calm today, trying her best to motivate herself with the help with God. Patient shared the questions asked were a lot but is understanding that is what needed. Patient shared she has another appointment on May 11 for another interview. Patient shared she does have those hopelessness thoughts but due to her spiritual beliefs she does not intend to attempt. LCSW informed of [facility] (no cost) as well if [facility] does not work out; and provided phone number to agency. LCSW informed she is also welcome here. LCSW assessed for current SI. Patient denied. LCSW reviewed crisis plan of 911/ER or mobile crisis. LCSW will be mailing [facility] brochure as well.

## 2020-04-30 ENCOUNTER — Telehealth: Payer: Self-pay | Admitting: Clinical

## 2020-04-30 NOTE — Telephone Encounter (Signed)
LCSW contacted [facility] to discuss referral that had been completed. Pharmacologist informed patient was received and is eligible for their program and appointment is for tomorrow to continue interview and begin the program.

## 2020-05-01 ENCOUNTER — Other Ambulatory Visit: Payer: Self-pay | Admitting: Obstetrics and Gynecology

## 2020-05-01 DIAGNOSIS — Z1231 Encounter for screening mammogram for malignant neoplasm of breast: Secondary | ICD-10-CM

## 2020-05-04 ENCOUNTER — Other Ambulatory Visit (INDEPENDENT_AMBULATORY_CARE_PROVIDER_SITE_OTHER): Payer: Self-pay

## 2020-05-04 DIAGNOSIS — Z1211 Encounter for screening for malignant neoplasm of colon: Secondary | ICD-10-CM

## 2020-05-04 LAB — POC HEMOCCULT BLD/STL (HOME/3-CARD/SCREEN)
Card #2 Fecal Occult Blod, POC: NEGATIVE
Card #3 Fecal Occult Blood, POC: NEGATIVE
Fecal Occult Blood, POC: NEGATIVE

## 2020-06-12 ENCOUNTER — Other Ambulatory Visit: Payer: Self-pay

## 2020-06-27 ENCOUNTER — Other Ambulatory Visit: Payer: Self-pay

## 2020-06-27 ENCOUNTER — Ambulatory Visit: Admission: RE | Admit: 2020-06-27 | Payer: No Typology Code available for payment source | Source: Ambulatory Visit

## 2020-06-27 VITALS — Wt 137.0 lb

## 2020-06-27 DIAGNOSIS — D509 Iron deficiency anemia, unspecified: Secondary | ICD-10-CM

## 2020-06-29 LAB — CBC WITH DIFFERENTIAL/PLATELET
Basophils Absolute: 0.1 10*3/uL (ref 0.0–0.2)
Basos: 1 %
EOS (ABSOLUTE): 0.3 10*3/uL (ref 0.0–0.4)
Eos: 6 %
Hematocrit: 33.9 % — ABNORMAL LOW (ref 34.0–46.6)
Hemoglobin: 9.8 g/dL — ABNORMAL LOW (ref 11.1–15.9)
Immature Grans (Abs): 0 10*3/uL (ref 0.0–0.1)
Immature Granulocytes: 0 %
Lymphocytes Absolute: 1.4 10*3/uL (ref 0.7–3.1)
Lymphs: 31 %
MCH: 22.6 pg — ABNORMAL LOW (ref 26.6–33.0)
MCHC: 28.9 g/dL — ABNORMAL LOW (ref 31.5–35.7)
MCV: 78 fL — ABNORMAL LOW (ref 79–97)
Monocytes Absolute: 0.3 10*3/uL (ref 0.1–0.9)
Monocytes: 7 %
Neutrophils Absolute: 2.5 10*3/uL (ref 1.4–7.0)
Neutrophils: 55 %
Platelets: 340 10*3/uL (ref 150–450)
RBC: 4.33 x10E6/uL (ref 3.77–5.28)
RDW: 17.1 % — ABNORMAL HIGH (ref 11.7–15.4)
WBC: 4.6 10*3/uL (ref 3.4–10.8)

## 2020-08-16 ENCOUNTER — Encounter: Payer: Self-pay | Admitting: Internal Medicine

## 2020-09-04 ENCOUNTER — Telehealth: Payer: Self-pay

## 2020-09-04 NOTE — Telephone Encounter (Signed)
Left message for patient via interpreter Rudene Anda about scheduling appointment for mammogram.

## 2020-09-06 ENCOUNTER — Other Ambulatory Visit: Payer: Self-pay

## 2020-09-06 DIAGNOSIS — Z1231 Encounter for screening mammogram for malignant neoplasm of breast: Secondary | ICD-10-CM

## 2020-10-02 ENCOUNTER — Ambulatory Visit: Payer: No Typology Code available for payment source

## 2020-10-09 ENCOUNTER — Ambulatory Visit: Payer: No Typology Code available for payment source

## 2020-11-22 ENCOUNTER — Encounter: Payer: Self-pay | Admitting: Internal Medicine

## 2021-02-01 ENCOUNTER — Other Ambulatory Visit: Payer: Self-pay | Admitting: Internal Medicine

## 2021-02-01 ENCOUNTER — Encounter: Payer: Self-pay | Admitting: Internal Medicine

## 2021-02-01 ENCOUNTER — Other Ambulatory Visit: Payer: Self-pay

## 2021-02-01 ENCOUNTER — Ambulatory Visit (INDEPENDENT_AMBULATORY_CARE_PROVIDER_SITE_OTHER): Payer: Self-pay | Admitting: Internal Medicine

## 2021-02-01 VITALS — BP 102/68 | HR 71 | Resp 12 | Ht <= 58 in | Wt 133.5 lb

## 2021-02-01 DIAGNOSIS — Z1231 Encounter for screening mammogram for malignant neoplasm of breast: Secondary | ICD-10-CM

## 2021-02-01 DIAGNOSIS — Z87898 Personal history of other specified conditions: Secondary | ICD-10-CM

## 2021-02-01 DIAGNOSIS — Z124 Encounter for screening for malignant neoplasm of cervix: Secondary | ICD-10-CM

## 2021-02-01 DIAGNOSIS — D509 Iron deficiency anemia, unspecified: Secondary | ICD-10-CM

## 2021-02-01 DIAGNOSIS — Z0000 Encounter for general adult medical examination without abnormal findings: Secondary | ICD-10-CM
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
## 2021-02-02 LAB — CBC WITH DIFFERENTIAL/PLATELET
Basophils Absolute: 0.1 10*3/uL (ref 0.0–0.2)
Basos: 1 %
EOS (ABSOLUTE): 0.3 10*3/uL (ref 0.0–0.4)
Eos: 5 %
Hematocrit: 32 % — ABNORMAL LOW (ref 34.0–46.6)
Hemoglobin: 9.5 g/dL — ABNORMAL LOW (ref 11.1–15.9)
Immature Grans (Abs): 0 10*3/uL (ref 0.0–0.1)
Immature Granulocytes: 0 %
Lymphocytes Absolute: 1.7 10*3/uL (ref 0.7–3.1)
Lymphs: 34 %
MCH: 21.3 pg — ABNORMAL LOW (ref 26.6–33.0)
MCHC: 29.7 g/dL — ABNORMAL LOW (ref 31.5–35.7)
MCV: 72 fL — ABNORMAL LOW (ref 79–97)
Monocytes Absolute: 0.4 10*3/uL (ref 0.1–0.9)
Monocytes: 8 %
Neutrophils Absolute: 2.6 10*3/uL (ref 1.4–7.0)
Neutrophils: 52 %
Platelets: 398 10*3/uL (ref 150–450)
RBC: 4.47 x10E6/uL (ref 3.77–5.28)
RDW: 17.8 % — ABNORMAL HIGH (ref 11.7–15.4)
WBC: 4.9 10*3/uL (ref 3.4–10.8)

## 2021-02-02 LAB — COMPREHENSIVE METABOLIC PANEL
ALT: 10 IU/L (ref 0–32)
AST: 15 IU/L (ref 0–40)
Albumin/Globulin Ratio: 1.7 (ref 1.2–2.2)
Albumin: 4.6 g/dL (ref 3.8–4.9)
Alkaline Phosphatase: 78 IU/L (ref 44–121)
BUN/Creatinine Ratio: 20 (ref 9–23)
BUN: 13 mg/dL (ref 6–24)
Bilirubin Total: 0.3 mg/dL (ref 0.0–1.2)
CO2: 22 mmol/L (ref 20–29)
Calcium: 9.2 mg/dL (ref 8.7–10.2)
Chloride: 104 mmol/L (ref 96–106)
Creatinine, Ser: 0.65 mg/dL (ref 0.57–1.00)
GFR calc Af Amer: 119 mL/min/{1.73_m2} (ref >59)
GFR calc non Af Amer: 103 mL/min/{1.73_m2} (ref >59)
Globulin, Total: 2.7 g/dL (ref 1.5–4.5)
Glucose: 74 mg/dL (ref 65–99)
Potassium: 4.5 mmol/L (ref 3.5–5.2)
Sodium: 141 mmol/L (ref 134–144)
Total Protein: 7.3 g/dL (ref 6.0–8.5)

## 2021-02-02 LAB — LIPID PANEL W/O CHOL/HDL RATIO
Cholesterol, Total: 213 mg/dL — ABNORMAL HIGH (ref 100–199)
HDL: 85 mg/dL (ref >39)
LDL Chol Calc (NIH): 117 mg/dL — ABNORMAL HIGH (ref 0–99)
Triglycerides: 64 mg/dL (ref 0–149)
VLDL Cholesterol Cal: 11 mg/dL (ref 5–40)

## 2021-02-02 LAB — HGB A1C W/O EAG: Hgb A1c MFr Bld: 5.8 % — ABNORMAL HIGH (ref 4.8–5.6)

## 2021-02-08 LAB — CYTOLOGY - PAP

## 2021-02-13 ENCOUNTER — Encounter: Payer: Self-pay | Admitting: Emergency Medicine

## 2021-02-13 ENCOUNTER — Emergency Department: Payer: Worker's Compensation

## 2021-02-13 ENCOUNTER — Other Ambulatory Visit: Payer: Self-pay

## 2021-02-13 ENCOUNTER — Emergency Department
Admission: EM | Admit: 2021-02-13 | Discharge: 2021-02-13 | Disposition: A | Discharge status: Home / Self Care | Payer: Worker's Compensation | Attending: Emergency Medicine | Admitting: Emergency Medicine

## 2021-02-13 DIAGNOSIS — Y990 Civilian activity done for income or pay: Secondary | ICD-10-CM | POA: Diagnosis not present

## 2021-02-13 DIAGNOSIS — Y9389 Activity, other specified: Secondary | ICD-10-CM | POA: Insufficient documentation

## 2021-02-13 DIAGNOSIS — M79632 Pain in left forearm: Secondary | ICD-10-CM | POA: Diagnosis not present

## 2021-02-13 DIAGNOSIS — S5002XA Contusion of left elbow, initial encounter: Secondary | ICD-10-CM | POA: Insufficient documentation

## 2021-02-13 DIAGNOSIS — W2209XA Striking against other stationary object, initial encounter: Secondary | ICD-10-CM | POA: Diagnosis not present

## 2021-02-13 DIAGNOSIS — S59902A Unspecified injury of left elbow, initial encounter: Secondary | ICD-10-CM | POA: Diagnosis present

## 2021-02-13 MED ORDER — ACETAMINOPHEN 325 MG PO TABS
650.0000 mg | ORAL_TABLET | Freq: Once | ORAL | Status: AC
  Administered 2021-02-13: 650 mg via ORAL
  Filled 2021-02-13: qty 2

## 2021-02-13 MED ORDER — MELOXICAM 7.5 MG PO TABS
15.0000 mg | ORAL_TABLET | Freq: Once | ORAL | Status: AC
  Administered 2021-02-13: 15 mg via ORAL
  Filled 2021-02-13: qty 2

## 2021-02-13 MED ORDER — MELOXICAM 15 MG PO TABS: 15.0000 mg | ORAL_TABLET | Freq: Every day | ORAL | 0 refills | Status: AC

## 2021-02-13 NOTE — ED Triage Notes (Signed)
Pt comes into the ED via POV c/o left arm pain after hitting it on something at work.  Pt has no obvious deformity and minimal swelling noted to the area.  Pt in [facility].

## 2021-02-13 NOTE — ED Provider Notes (Signed)
[facility] Emergency Department Provider Note  ____________________________________________   Event Date/Time   First MD Initiated Contact with Patient 02/13/21 1745     (approximate)  I have reviewed the triage vital signs and the nursing notes.   HISTORY  Chief Complaint Arm Pain  HPI Ashley Meyers is a 50 y.o. female reports to the emergency department for evaluation of left forearm and elbow pain after an injury at work.  Patient states that she accidentally bumped it into a large stationary metal object.  She believes that she may have hit one of the nerves in her elbow as she reports that there is some radiation of the pain down her forearm and into her wrist.  She denies any weakness, numbness, tingling.  She denies any history of injury to the elbow.  No alleviating measures have been attempted.         History reviewed. No pertinent past medical history.  There are no problems to display for this patient.   History reviewed. No pertinent surgical history.  Prior to Admission medications   Medication Sig Start Date End Date Taking? Authorizing Provider  meloxicam (MOBIC) 15 MG tablet Take 1 tablet (15 mg total) by mouth daily for 15 days. 02/13/21 02/28/21 Yes Marlana Salvage, PA    Allergies Patient has no known allergies.  History reviewed. No pertinent family history.  Social History Social History   Tobacco Use  . Smoking status: Never Smoker  . Smokeless tobacco: Never Used  Substance Use Topics  . Alcohol use: Not Currently    Review of Systems Constitutional: No fever/chills Eyes: No visual changes. ENT: No sore throat. Cardiovascular: Denies chest pain. Respiratory: Denies shortness of breath. Gastrointestinal: No abdominal pain.  No nausea, no vomiting.  No diarrhea.  No constipation. Genitourinary: Negative for dysuria. Musculoskeletal: + Left arm pain, negative for back pain. Skin: Negative for  rash. Neurological: Negative for headaches, focal weakness or numbness.  ____________________________________________   PHYSICAL EXAM:  VITAL SIGNS: ED Triage Vitals  Enc Vitals Group     BP 02/13/21 1738 108/64     Pulse Rate 02/13/21 1738 63     Resp 02/13/21 1738 16     Temp 02/13/21 1738 98.4 F (36.9 C)     Temp Source 02/13/21 1738 Oral     SpO2 --      Weight 02/13/21 1740 135 lb (61.2 kg)     Height 02/13/21 1740 4\' 11"  (1.499 m)     Head Circumference --      Peak Flow --      Pain Score 02/13/21 1739 7     Pain Loc --      Pain Edu? --      Excl. in [place]? --    Constitutional: Alert and oriented. Well appearing and in no acute distress. Eyes: Conjunctivae are normal. PERRL. EOMI. Head: Atraumatic. Nose: No congestion/rhinnorhea. Mouth/Throat: Mucous membranes are moist. Neck: No stridor.   Cardiovascular: Normal rate, regular rhythm. Grossly normal heart sounds.  Good peripheral circulation. Respiratory: Normal respiratory effort.  No retractions. Lungs CTAB. Musculoskeletal: There is tenderness to palpation along the posterior lateral aspect of the forearm in approximately the proximal third.  No tenderness of the olecranon or epicondyles.  No tenderness in the medial aspect.  No tenderness of the distal radius, ulna or carpal bones.  Radial pulse 2+, capillary refill less than 3 seconds.  Patient is able to actively flex and extend the elbow, wrist and  digits without increased pain or difficulty. Neurologic:  Normal speech and language. No gross focal neurologic deficits are appreciated. No gait instability. Skin:  Skin is warm, dry and intact. No rash noted. Psychiatric: Mood and affect are normal. Speech and behavior are normal.  ____________________________________________  RADIOLOGY I, Marlana Salvage, personally viewed and evaluated these images (plain radiographs) as part of my medical decision making, as well as reviewing the written report by the  radiologist.  ED provider interpretation: No acute fracture identified  Official radiology report(s): DG Forearm Left  Result Date: 02/13/2021 CLINICAL DATA:  Left forearm pain, trauma EXAM: LEFT FOREARM - 2 VIEW COMPARISON:  None. FINDINGS: Frontal and lateral views of the left forearm demonstrate no fractures. Alignment is anatomic. Joint spaces are well preserved. Soft tissues are normal. IMPRESSION: 1. Unremarkable left forearm. Electronically Signed   By: Randa Ngo M.D.   On: 02/13/2021 18:38    ____________________________________________   INITIAL IMPRESSION / ASSESSMENT AND PLAN / ED COURSE  As part of my medical decision making, I reviewed the following data within the [facility] notes reviewed and incorporated, Radiograph reviewed and Notes from prior ED visits        Patient is a 50 year old female who presents to the emergency department for evaluation after hitting her posterior forearm on a metal object at work.  See HPI for further details.  In triage, the patient has normal vital signs.  On physical exam there is some mild tenderness to the posterior lateral aspect of the left forearm, but is neurovascularly intact and able to actively move all joints of the left upper extremity.  X-rays were obtained and are negative for any acute fracture.  Findings most consistent with soft tissue contusion of the forearm.  Will initiate anti-inflammatory therapy for the patient which she is amenable with.  Recommended Ortho follow-up if not improved in 2 weeks.  Patient stable this time for outpatient follow-up.      ____________________________________________   FINAL CLINICAL IMPRESSION(S) / ED DIAGNOSES  Final diagnoses:  Contusion of left elbow, initial encounter     ED Discharge Orders         Ordered    meloxicam (MOBIC) 15 MG tablet  Daily        02/13/21 1846          *Please note:  Ashley Meyers was evaluated in Emergency Department  on 02/13/2021 for the symptoms described in the history of present illness. She was evaluated in the context of the global COVID-19 pandemic, which necessitated consideration that the patient might be at risk for infection with the SARS-CoV-2 virus that causes COVID-19. Institutional protocols and algorithms that pertain to the evaluation of patients at risk for COVID-19 are in a state of rapid change based on information released by regulatory bodies including the CDC and federal and state organizations. These policies and algorithms were followed during the patient's care in the ED.  Some ED evaluations and interventions may be delayed as a result of limited staffing during and the pandemic.*   Note:  This document was prepared using Dragon voice recognition software and may include unintentional dictation errors.   Marlana Salvage, PA 02/13/21 Ashley Meyers    Duffy Bruce, MD 02/14/21 [phone]

## 2021-02-13 NOTE — ED Notes (Addendum)
Pt here for workmans comp injury.  Paper, signed by employer, brought in by pt states initial [place] comp exam as reason for visit.  No labs requested.  Form placed in to be scanned box to go to medical records to be scanned into pts chart.

## 2021-02-13 NOTE — Discharge Instructions (Signed)
Please take the Mobic as prescribed, once daily with food. You may also take Tylenol, up to 1000 mg 4 times daily as needed for pain.  Please follow-up with orthopedics if not improving, or return to the emergency department with any worsening.

## 2021-02-22 ENCOUNTER — Other Ambulatory Visit: Payer: Self-pay | Admitting: Clinical

## 2021-02-25 ENCOUNTER — Other Ambulatory Visit (INDEPENDENT_AMBULATORY_CARE_PROVIDER_SITE_OTHER): Payer: Self-pay | Admitting: Internal Medicine

## 2021-02-25 DIAGNOSIS — Z1211 Encounter for screening for malignant neoplasm of colon: Secondary | ICD-10-CM

## 2021-02-25 LAB — POC HEMOCCULT BLD/STL (HOME/3-CARD/SCREEN)
Card #2 Fecal Occult Blod, POC: NEGATIVE
Card #3 Fecal Occult Blood, POC: NEGATIVE
Fecal Occult Blood, POC: NEGATIVE

## 2021-02-28 ENCOUNTER — Other Ambulatory Visit: Payer: Self-pay | Admitting: Clinical

## 2021-03-04 ENCOUNTER — Telehealth: Payer: Self-pay | Admitting: Clinical
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
## 2021-05-08 ENCOUNTER — Encounter: Payer: Self-pay | Admitting: Internal Medicine

## 2022-02-03 ENCOUNTER — Encounter: Payer: Self-pay | Admitting: Internal Medicine

## 2022-05-19 ENCOUNTER — Encounter: Payer: Self-pay | Admitting: Internal Medicine

## 2022-05-22 ENCOUNTER — Encounter: Payer: Self-pay | Admitting: Internal Medicine

## 2022-06-14 ENCOUNTER — Emergency Department: Payer: No Typology Code available for payment source

## 2022-06-14 ENCOUNTER — Encounter: Payer: Self-pay | Admitting: Emergency Medicine

## 2022-06-14 DIAGNOSIS — Y990 Civilian activity done for income or pay: Secondary | ICD-10-CM | POA: Insufficient documentation

## 2022-06-14 DIAGNOSIS — S3992XA Unspecified injury of lower back, initial encounter: Secondary | ICD-10-CM | POA: Diagnosis present

## 2022-06-14 DIAGNOSIS — S39012A Strain of muscle, fascia and tendon of lower back, initial encounter: Secondary | ICD-10-CM | POA: Insufficient documentation

## 2022-06-14 DIAGNOSIS — W010XXA Fall on same level from slipping, tripping and stumbling without subsequent striking against object, initial encounter: Secondary | ICD-10-CM | POA: Insufficient documentation

## 2022-06-14 NOTE — ED Triage Notes (Signed)
Pt presents to the ER from work. Reports she slipped and fell. Pt  is not sure if she hit her head or not, but feels like her balance is off, reports pain to right shoulder, right hip and pelvic area, and left knee. Pt talks in complete sentences no respiratory distress noted.

## 2022-06-15 ENCOUNTER — Emergency Department
Admission: EM | Admit: 2022-06-15 | Discharge: 2022-06-15 | Disposition: A | Discharge status: Home / Self Care | Payer: No Typology Code available for payment source | Attending: Emergency Medicine | Admitting: Emergency Medicine

## 2022-06-15 DIAGNOSIS — T148XXA Other injury of unspecified body region, initial encounter: Secondary | ICD-10-CM

## 2022-06-15 DIAGNOSIS — W19XXXA Unspecified fall, initial encounter: Secondary | ICD-10-CM

## 2022-06-17 ENCOUNTER — Other Ambulatory Visit: Payer: Self-pay

## 2022-06-17 ENCOUNTER — Emergency Department: Payer: No Typology Code available for payment source

## 2022-06-17 ENCOUNTER — Encounter: Payer: Self-pay | Admitting: Emergency Medicine

## 2022-06-17 ENCOUNTER — Emergency Department
Admission: EM | Admit: 2022-06-17 | Discharge: 2022-06-17 | Disposition: A | Discharge status: Home / Self Care | Payer: No Typology Code available for payment source | Attending: Emergency Medicine | Admitting: Emergency Medicine

## 2022-06-17 DIAGNOSIS — M542 Cervicalgia: Secondary | ICD-10-CM | POA: Insufficient documentation

## 2022-06-17 DIAGNOSIS — M545 Low back pain, unspecified: Secondary | ICD-10-CM | POA: Insufficient documentation

## 2022-06-17 DIAGNOSIS — R000 Tachycardia, unspecified: Secondary | ICD-10-CM | POA: Diagnosis not present

## 2022-06-17 DIAGNOSIS — W19XXXD Unspecified fall, subsequent encounter: Secondary | ICD-10-CM

## 2022-06-17 NOTE — ED Provider Notes (Signed)
[facility] Provider Note  Patient Contact: 8:47 PM (approximate)   History   Hip Pain   HPI  Ashley Meyers is a 51 y.o. female presents to the emergency department with low back pain and neck pain.  Patient states that on Saturday, she experienced a fall at work.  She was off Sunday and Monday and returned to work today and states that she was not able to perform her work duties due to increased pain.  No numbness or tingling in the upper and lower extremities.  No chest pain, chest tightness or abdominal pain.      Physical Exam   Triage Vital Signs: ED Triage Vitals  Enc Vitals Group     BP 06/17/22 1954 (!) 120/57     Pulse Rate 06/17/22 1954 62     Resp 06/17/22 1954 16     Temp 06/17/22 1954 98.6 F (37 C)     Temp Source 06/17/22 1954 Oral     SpO2 06/17/22 1954 98 %     Weight 06/17/22 1955 132 lb 4.4 oz (60 kg)     Height 06/17/22 1955 4\' 11"  (1.499 m)     Head Circumference --      Peak Flow --      Pain Score 06/17/22 1954 7     Pain Loc --      Pain Edu? --      Excl. in GC? --     Most recent vital signs: Vitals:   06/17/22 1954 06/17/22 2048  BP: (!) 120/57 (!) 119/54  Pulse: 62 (!) 54  Resp: 16 17  Temp: 98.6 F (37 C)   SpO2: 98% 99%     General: Alert and in no acute distress. Eyes:  PERRL. EOMI. Head: No acute traumatic findings ENT:      Nose: No congestion/rhinnorhea.      Mouth/Throat: Mucous membranes are moist. Neck: No stridor. No cervical spine tenderness to palpation. Cardiovascular:  Good peripheral perfusion Respiratory: Normal respiratory effort without tachypnea or retractions. Lungs CTAB. Good air entry to the bases with no decreased or absent breath sounds. Gastrointestinal: Bowel sounds 4 quadrants. Soft and nontender to palpation. No guarding or rigidity. No palpable masses. No distention. No CVA tenderness. Musculoskeletal: Full range of motion to all extremities.  Neurologic:  No gross  focal neurologic deficits are appreciated.  Skin:   No rash noted Other:   ED Results / Procedures / Treatments   Labs (all labs ordered are listed, but only abnormal results are displayed) Labs Reviewed - No data to display    RADIOLOGY  I personally viewed and evaluated these images as part of my medical decision making, as well as reviewing the written report by the radiologist.  ED Provider Interpretation: No evidence of acute cervical spine fracture or lumbar spine fracture   PROCEDURES:  Critical Care performed: No  Procedures   MEDICATIONS ORDERED IN ED: Medications - No data to display   IMPRESSION / MDM / ASSESSMENT AND PLAN / ED COURSE  I reviewed the triage vital signs and the nursing notes.                              Assessment and plan: Low back pain:  Neck pain:  51 year old female presents to the emergency department with neck pain and low back pain after a fall on 06/15/2022.  Patient was mildly bradycardic at triage but  vital signs otherwise reassuring.  Patient was alert, active and nontoxic-appearing with no neurodeficits noted.  I personally reviewed x-rays of the cervical spine and lumbar spine and there were no acute bony abnormalities visualized.  Recommended Tylenol and ibuprofen alternating for pain.  Return precautions were given to return with new or worsening symptoms.   FINAL CLINICAL IMPRESSION(S) / ED DIAGNOSES   Final diagnoses:  Fall, subsequent encounter     Rx / DC Orders   ED Discharge Orders     None        Note:  This document was prepared using Dragon voice recognition software and may include unintentional dictation errors.   Pia Mau [place], Cordelia Poche 06/17/22 2157    Merwyn Katos, MD 06/17/22 [phone]

## 2022-06-17 NOTE — ED Triage Notes (Signed)
Pt presents to the ER from work with left hip pain. Pt reports she was seen here on Saturday after falling at work, pt reports she was off Sunday and Monday and went to work today and was not able to perform her job duties due to increase pain to left hip. Pt denies any other symptom

## 2022-06-17 NOTE — ED Notes (Signed)
See triage note. Pt declined offer of spanish interpreter. Pt ambulatory to room but hunched over while walking and limping. Pt otherwise steady.

## 2022-06-17 NOTE — Discharge Instructions (Addendum)
Take 650 mg of Tylenol alternating with 600 mg of ibuprofen.

## 2022-09-09 ENCOUNTER — Ambulatory Visit: Payer: Self-pay | Admitting: Internal Medicine

## 2022-09-09 ENCOUNTER — Encounter: Payer: Self-pay | Admitting: Internal Medicine

## 2022-09-09 VITALS — BP 130/70 | HR 60 | Resp 12 | Ht <= 58 in | Wt 137.0 lb

## 2022-09-09 DIAGNOSIS — Z0000 Encounter for general adult medical examination without abnormal findings: Secondary | ICD-10-CM

## 2022-09-09 DIAGNOSIS — Z114 Encounter for screening for human immunodeficiency virus [HIV]: Secondary | ICD-10-CM

## 2022-09-09 DIAGNOSIS — D509 Iron deficiency anemia, unspecified: Secondary | ICD-10-CM

## 2022-09-09 DIAGNOSIS — Z1159 Encounter for screening for other viral diseases: Secondary | ICD-10-CM

## 2022-09-09 DIAGNOSIS — R102 Pelvic and perineal pain unspecified side: Secondary | ICD-10-CM

## 2022-09-09 DIAGNOSIS — R1013 Epigastric pain: Secondary | ICD-10-CM | POA: Insufficient documentation

## 2022-09-09 DIAGNOSIS — F439 Reaction to severe stress, unspecified: Secondary | ICD-10-CM

## 2022-09-09 DIAGNOSIS — R1010 Upper abdominal pain, unspecified: Secondary | ICD-10-CM

## 2022-09-09 DIAGNOSIS — E7800 Pure hypercholesterolemia, unspecified: Secondary | ICD-10-CM

## 2022-09-09 DIAGNOSIS — Z1231 Encounter for screening mammogram for malignant neoplasm of breast: Secondary | ICD-10-CM

## 2022-09-09 DIAGNOSIS — Z87898 Personal history of other specified conditions: Secondary | ICD-10-CM

## 2022-09-09 NOTE — Progress Notes (Signed)
Subjective:    Patient ID: Ashley Meyers, female   DOB: 1971/03/24, 51 y.o.   MRN: [id number]   HPI  Very poor historian  CPE without pap  1.  Pap:  Last pap was 01/2021 and normal.    2.  Mammogram:  Last mammogram done at [facility].   Not clear how she ended up there and she cannot explain as well.  Female first cousin in early to mid 19s diagnosed in past with breast cancer.  She is uncertain if treatment is ongoing.  She is still living.    3.  Osteoprevention:  Does drink milk, but not enough.  Not physically active.  Willing to increase milk to 3-4 servings daily and start walking.    4.  Guaiac Cards/FIT:  Last 02/2021 and negative.    5.  Colonoscopy:  Never.  No family history of colon cancer.  6.  Immunizations:  Has refused vaccinations in the past.  Was to come back for Tdap and COVID, but did not.  Discussed vaccines and importance at length. She gives a history of a husband of a friend who died after getting some vaccine.    7.  Glucose/Cholesterol:  history of prediabetes with A1C at 5.8% in 2022.  Cholesterol panel excellent with HDL of 85 and only mildly elevated total. Lipid Panel     Component Value Date/Time   CHOL 213 (H) 02/01/2021 1312   TRIG 64 02/01/2021 1312   TRIG 203 (H) 02/07/2016 1227   HDL 85 02/01/2021 1312   CHOLHDL 3.0 02/07/2016 1227   CHOLHDL 2.6 11/24/2014 1429   VLDL 21 11/24/2014 1429   LDLCALC 117 (H) 02/01/2021 1312   LABVLDL 11 02/01/2021 1312     Current Meds  Medication Sig   Omega-3 Fatty Acids (OMEGA 3 PO) Take by mouth.   No Known Allergies  Past Medical History:  Diagnosis Date   Allergy    Anemia    Anxiety    Depression    GERD (gastroesophageal reflux disease)    No past surgical history on file. Family History  Problem Relation Age of Onset   Tuberculosis Father    Family Status  Relation Name Status   Mother  Deceased at age 65 yo       Brain Tumor   Father  Deceased at age 53 yo       Hx  TB when younger with related complications leading to death much later in life?   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  Tobacco Use   Smoking status: Never    Passive exposure: Never   Smokeless tobacco: Never  Vaping Use   Vaping Use: Never used  Substance and Sexual Activity   Alcohol use: Not Currently   Drug use: No   Sexual activity: Not Currently    Comment: Has not been sexually active for many years.  Other Topics Concern   Not  on file  Social History Narrative   Originally from [place]   Came to [place] In 2000   Lives with a sister now in [place].   Social Determinants of Health   Financial Resource Strain: Medium Risk (02/01/2021)   Overall Financial Resource Strain (CARDIA)    Difficulty of Paying Living Expenses: Somewhat hard  Food Insecurity: No Food Insecurity (09/09/2022)   Hunger Vital Sign    Worried About Running Out of Food in the Last Year: Never true    Ran Out of Food in the Last Year: Never true  Transportation Needs: No Transportation Needs (09/09/2022)   PRAPARE - Hydrologist (Medical): No    Lack of Transportation (Non-Medical): No  Physical Activity: Inactive (09/09/2022)   Exercise Vital Sign    Days of Exercise per Week: 0 days    Minutes of Exercise per Session: 0 min  Stress: Stress Concern Present (09/09/2022)   [place]    Feeling of Stress : Rather much  Social Connections: Somewhat Isolated (01/21/2018)   Social Connection and Isolation Panel  [NHANES]    Frequency of Communication with Friends and Family: Twice a week    Frequency of Social Gatherings with Friends and Family: Never    Attends Religious Services: More than 4 times per year    Active Member of [employer] or Organizations: Yes    Attends Archivist Meetings: More than 4 times per year    Marital Status: Never married  Intimate Partner Violence: Unknown (02/03/2018)   Humiliation, Afraid, Rape, and Kick questionnaire    Fear of Current or Ex-Partner: No    Emotionally Abused: No    Physically Abused: No    Sexually Abused: Not asked      Review of Systems  HENT:  Positive for sore throat (More just irritation as if needs to clear of mucous.  No productive cough or cough at all.  No acid or brash taste.). Negative for ear pain, rhinorrhea, sinus pressure (Not clear.) and trouble swallowing.   Respiratory:  Positive for shortness of breath (1 month ago.  Very difficult to follow.  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   Gastrointestinal:  Positive for abdominal pain (RUQ and LUQ pain she is unable characterize.  describes sort of an ache.  On and off starting about 1 month ago for 2 weeks.  When she urinates, she feels there is a lot of foam in the toilet.  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 Negative for blood in stool (No melena.), constipation, diarrhea and nausea.       History of RLQ pain that resolved on own some time ago, but seems to be recurring.  Very difficult to get a history to describe.    Skin:        Hyperpigmentation patches on face.  No history of pregnancy and never used hormones.  Describes itching vs pain in different places of her body:  arms, back, feet.  No rash associated.    Psychiatric/Behavioral:         Stress from her manager at job.  Feels her manager pressures her.  Had a bad interaction with her.  Does not feel her  manager is reasonable.        Objective:   BP 130/70 (BP Location: Right Arm,  Patient Position: Sitting, Cuff Size: Normal)   Pulse 60   Resp 12   Ht '4\' 9"'$  (1.448 m)   Wt 137 lb (62.1 kg)   LMP  (LMP Unknown) Comment: Thinks no period for about 1 year.  BMI 29.65 kg/m   Physical Exam Constitutional:      Appearance: She is well-developed.  HENT:     Head: Normocephalic and atraumatic.     Right Ear: Tympanic membrane, ear canal and external ear normal.     Left Ear: Tympanic membrane, ear canal and external ear normal.     Nose: Nose normal.     Mouth/Throat:     Mouth: Mucous membranes are moist.     Pharynx: Oropharynx is clear.  Eyes:     Extraocular Movements: Extraocular movements intact.     Conjunctiva/sclera: Conjunctivae normal.     Pupils: Pupils are equal, round, and reactive to light.     Comments: Discs sharp  Neck:     Thyroid: No thyroid mass or thyromegaly.  Cardiovascular:     Rate and Rhythm: Normal rate and regular rhythm.     Heart sounds: S1 normal and S2 normal.     No S3 or S4 sounds.     Comments: No carotid bruits.  Carotid, radial, femoral, DP and PT pulses normal and equal.    Pulmonary:     Breath sounds: Normal breath sounds and air entry.  Chest:  Breasts:    Right: No inverted nipple, mass or nipple discharge.     Left: No inverted nipple, mass or nipple discharge.  Abdominal:     General: Bowel sounds are normal.     Palpations: Abdomen is soft. There is no hepatomegaly, splenomegaly or mass.     Tenderness: There is no abdominal tenderness.  Genitourinary:    Comments: Normal external female genitalia. No uterine or adnexal mass or tenderness Musculoskeletal:     Cervical back: Normal range of motion and neck supple.     Right lower leg: No edema.     Left lower leg: No edema.     Right foot: Normal range of motion. No deformity.     Left foot: Normal range of motion. No deformity.  Feet:     Comments: Brings up anterior  right ankle pain during exam.  No findings on exam.   Lymphadenopathy:     Head:     Right side of head: No submental or submandibular adenopathy.     Left side of head: No submental or submandibular adenopathy.     Cervical: No cervical adenopathy.     Upper Body:     Right upper body: No supraclavicular or axillary adenopathy.     Left upper body: No supraclavicular or axillary adenopathy.     Lower Body: No right inguinal adenopathy. No left inguinal adenopathy.  Skin:    Findings: No rash.     Comments: Patches of nonpalpable hyperpigmentation across forehead to brow line and then a crescent of hyperpigmentation in upper lid to just below brow line. Bilateral jaw line and over zygomatic arch areas.  Neurological:     Mental Status: She is alert.      Assessment & Plan    CPE BCCCP for mammogram.  Discussed could do this more locally.   CBC, CMP, FLP, A1C, HIV, Hep C screen Refuses all vaccines today--very anxious to consider. Failed to give her FIT to return--will call and ask her to pick up when if for  session with [facility].   2.  Melasma:  discussed 30 spf sunscreen when outside and considering a bleaching agent to affected areas if significant concern.    3.  Prediabetes/hypercholesterolemia with very high HDL:  A1C and FLP today.    4.  History of anemia:  CBC  5.  Suspect generalized anxiety, worsened by stress of work place.    Warm hand off to [place]  6. Multiple pain complaints:  labs as above.  No findings on exam.

## 2022-09-10 LAB — CBC WITH DIFFERENTIAL/PLATELET
Basophils Absolute: 0 10*3/uL (ref 0.0–0.2)
Basos: 1 %
EOS (ABSOLUTE): 0.3 10*3/uL (ref 0.0–0.4)
Eos: 6 %
Hematocrit: 31 % — ABNORMAL LOW (ref 34.0–46.6)
Hemoglobin: 9.2 g/dL — ABNORMAL LOW (ref 11.1–15.9)
Immature Grans (Abs): 0 10*3/uL (ref 0.0–0.1)
Immature Granulocytes: 0 %
Lymphocytes Absolute: 1.7 10*3/uL (ref 0.7–3.1)
Lymphs: 35 %
MCH: 21.8 pg — ABNORMAL LOW (ref 26.6–33.0)
MCHC: 29.7 g/dL — ABNORMAL LOW (ref 31.5–35.7)
MCV: 74 fL — ABNORMAL LOW (ref 79–97)
Monocytes Absolute: 0.3 10*3/uL (ref 0.1–0.9)
Monocytes: 7 %
Neutrophils Absolute: 2.5 10*3/uL (ref 1.4–7.0)
Neutrophils: 51 %
Platelets: 280 10*3/uL (ref 150–450)
RBC: 4.22 x10E6/uL (ref 3.77–5.28)
RDW: 18.8 % — ABNORMAL HIGH (ref 11.7–15.4)
WBC: 4.8 10*3/uL (ref 3.4–10.8)

## 2022-09-10 LAB — HGB A1C W/O EAG: Hgb A1c MFr Bld: 6.1 % — ABNORMAL HIGH (ref 4.8–5.6)

## 2022-09-10 LAB — HIV ANTIBODY (ROUTINE TESTING W REFLEX): HIV Screen 4th Generation wRfx: NONREACTIVE

## 2022-09-10 LAB — COMPREHENSIVE METABOLIC PANEL
ALT: 8 IU/L (ref 0–32)
AST: 13 IU/L (ref 0–40)
Albumin/Globulin Ratio: 1.5 (ref 1.2–2.2)
Albumin: 4.2 g/dL (ref 3.8–4.9)
Alkaline Phosphatase: 81 IU/L (ref 44–121)
BUN/Creatinine Ratio: 26 — ABNORMAL HIGH (ref 9–23)
BUN: 15 mg/dL (ref 6–24)
Bilirubin Total: 0.2 mg/dL (ref 0.0–1.2)
CO2: 25 mmol/L (ref 20–29)
Calcium: 8.7 mg/dL (ref 8.7–10.2)
Chloride: 105 mmol/L (ref 96–106)
Creatinine, Ser: 0.57 mg/dL (ref 0.57–1.00)
Globulin, Total: 2.8 g/dL (ref 1.5–4.5)
Glucose: 88 mg/dL (ref 70–99)
Potassium: 3.7 mmol/L (ref 3.5–5.2)
Sodium: 142 mmol/L (ref 134–144)
Total Protein: 7 g/dL (ref 6.0–8.5)
eGFR: 109 mL/min/{1.73_m2} (ref >59)

## 2022-09-10 LAB — LIPID PANEL W/O CHOL/HDL RATIO
Cholesterol, Total: 211 mg/dL — ABNORMAL HIGH (ref 100–199)
HDL: 84 mg/dL (ref >39)
LDL Chol Calc (NIH): 116 mg/dL — ABNORMAL HIGH (ref 0–99)
Triglycerides: 60 mg/dL (ref 0–149)
VLDL Cholesterol Cal: 11 mg/dL (ref 5–40)

## 2022-09-10 LAB — HEPATITIS C ANTIBODY: Hep C Virus Ab: NONREACTIVE

## 2022-09-10 MED ORDER — FERROUS GLUCONATE 324 (38 FE) MG PO TABS: ORAL_TABLET | ORAL | 3 refills | Status: AC

## 2022-09-10 NOTE — Addendum Note (Signed)
Addended by: Marcelino Duster on: 09/10/2022 08:53 AM   Modules accepted: Orders

## 2022-09-21 ENCOUNTER — Emergency Department: Payer: No Typology Code available for payment source

## 2022-09-21 ENCOUNTER — Emergency Department
Admission: EM | Admit: 2022-09-21 | Discharge: 2022-09-21 | Disposition: A | Discharge status: Home / Self Care | Payer: No Typology Code available for payment source | Attending: Emergency Medicine | Admitting: Emergency Medicine

## 2022-09-21 ENCOUNTER — Other Ambulatory Visit: Payer: Self-pay

## 2022-09-21 DIAGNOSIS — S199XXA Unspecified injury of neck, initial encounter: Secondary | ICD-10-CM | POA: Diagnosis present

## 2022-09-21 DIAGNOSIS — R1084 Generalized abdominal pain: Secondary | ICD-10-CM

## 2022-09-21 DIAGNOSIS — R519 Headache, unspecified: Secondary | ICD-10-CM | POA: Diagnosis not present

## 2022-09-21 DIAGNOSIS — S8011XA Contusion of right lower leg, initial encounter: Secondary | ICD-10-CM | POA: Diagnosis not present

## 2022-09-21 DIAGNOSIS — S161XXA Strain of muscle, fascia and tendon at neck level, initial encounter: Secondary | ICD-10-CM | POA: Diagnosis not present

## 2022-09-21 DIAGNOSIS — Y92410 Unspecified street and highway as the place of occurrence of the external cause: Secondary | ICD-10-CM | POA: Insufficient documentation

## 2022-09-21 NOTE — ED Notes (Signed)
Interpreter request submitted.

## 2022-09-21 NOTE — ED Notes (Signed)
Patient asked via interpreter to speak with provider. Galen Daft NP came back to the room and spoke with the patient. Patient was given discharge instructions via video interpreter.
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
## 2022-09-21 NOTE — ED Notes (Signed)
Discharge given via video interpreter, [phone]
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
## 2022-09-21 NOTE — ED Notes (Signed)
Pt EKG and Vitals done.

## 2022-09-23 ENCOUNTER — Other Ambulatory Visit: Payer: Self-pay

## 2022-09-23 ENCOUNTER — Telehealth: Payer: Self-pay

## 2022-09-23 DIAGNOSIS — D509 Iron deficiency anemia, unspecified: Secondary | ICD-10-CM
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
## 2022-09-24 ENCOUNTER — Encounter: Payer: Self-pay | Admitting: Internal Medicine

## 2022-09-24 LAB — IRON AND TIBC
Iron Saturation: 7 % — CL (ref 15–55)
Iron: 28 ug/dL (ref 27–159)
Total Iron Binding Capacity: 412 ug/dL (ref 250–450)
UIBC: 384 ug/dL (ref 131–425)

## 2022-09-24 LAB — VITAMIN B12: Vitamin B-12: 332 pg/mL (ref 232–1245)

## 2022-12-01 ENCOUNTER — Other Ambulatory Visit: Payer: Self-pay

## 2022-12-09 ENCOUNTER — Encounter: Payer: Self-pay | Admitting: Internal Medicine

## 2023-01-09 ENCOUNTER — Encounter: Payer: Self-pay | Admitting: Internal Medicine

## 2023-01-23 ENCOUNTER — Other Ambulatory Visit: Payer: Self-pay

## 2023-01-27 ENCOUNTER — Ambulatory Visit: Payer: Self-pay | Admitting: Internal Medicine

## 2023-02-05 ENCOUNTER — Encounter: Payer: Self-pay | Admitting: Nurse Practitioner

## 2023-02-24 ENCOUNTER — Other Ambulatory Visit: Payer: Self-pay

## 2023-02-26 ENCOUNTER — Other Ambulatory Visit: Payer: Self-pay

## 2023-02-26 DIAGNOSIS — Z87898 Personal history of other specified conditions: Secondary | ICD-10-CM

## 2023-02-26 DIAGNOSIS — E7800 Pure hypercholesterolemia, unspecified: Secondary | ICD-10-CM

## 2023-02-27 LAB — LIPID PANEL W/O CHOL/HDL RATIO
Cholesterol, Total: 210 mg/dL — ABNORMAL HIGH (ref 100–199)
HDL: 82 mg/dL (ref >39)
LDL Chol Calc (NIH): 117 mg/dL — ABNORMAL HIGH (ref 0–99)
Triglycerides: 59 mg/dL (ref 0–149)
VLDL Cholesterol Cal: 11 mg/dL (ref 5–40)

## 2023-02-27 LAB — HEMOGLOBIN A1C
Est. average glucose Bld gHb Est-mCnc: 120 mg/dL
Hgb A1c MFr Bld: 5.8 % — ABNORMAL HIGH (ref 4.8–5.6)

## 2023-03-02 ENCOUNTER — Ambulatory Visit: Payer: Self-pay | Admitting: Internal Medicine

## 2023-03-10 ENCOUNTER — Ambulatory Visit: Payer: No Typology Code available for payment source | Admitting: Nurse Practitioner

## 2023-03-10 NOTE — Progress Notes (Deleted)
Assessment:         Plan:      Chronic anemia, microcytic. Hgb stable at 9.2 as of last labs in Sept 2023.     History of Present Illness   Chief Complaint:   Ashley Meyers is a 52 y.o. year old female with a past medical history of X. See PMH / [place] for additional history.   Patient referred by PCP for microcytic anemia.   Most recent CBC in Epic 09/09/22. Hgb 9.2 ( at her baseline).  Oct 2023 TIBC 412, 7 % iron sat. Normal B12.    Previous GI history:         Previous Labs / Imaging::    Latest Ref Rng & Units 09/09/2022   12:30 PM 02/01/2021    1:12 PM 06/27/2020   11:10 AM  CBC  WBC 3.4 - 10.8 x10E3/uL 4.8  4.9  4.6   Hemoglobin 11.1 - 15.9 g/dL 9.2  9.5  9.8   Hematocrit 34.0 - 46.6 % 31.0  32.0  33.9   Platelets 150 - 450 x10E3/uL 280  398  340     No results found for: "LIPASE"    Latest Ref Rng & Units 09/09/2022   12:30 PM 02/01/2021    1:12 PM 04/12/2020   11:30 AM  CMP  Glucose 70 - 99 mg/dL 88  74  88   BUN 6 - 24 mg/dL 15  13  10    Creatinine 0.57 - 1.00 mg/dL 0.57  0.65  0.68   Sodium 134 - 144 mmol/L 142  141  140   Potassium 3.5 - 5.2 mmol/L 3.7  4.5  3.9   Chloride 96 - 106 mmol/L 105  104  102   CO2 20 - 29 mmol/L 25  22  25    Calcium 8.7 - 10.2 mg/dL 8.7  9.2  8.9   Total Protein 6.0 - 8.5 g/dL 7.0  7.3  7.0   Total Bilirubin 0.0 - 1.2 mg/dL 0.2  0.3  <0.2   Alkaline Phos 44 - 121 IU/L 81  78  82   AST 0 - 40 IU/L 13  15  20    ALT 0 - 32 IU/L 8  10  7            Imaging:  CT Cervical Spine Wo Contrast CLINICAL DATA:  Facial trauma, blunt  EXAM: CT HEAD WITHOUT CONTRAST  CT CERVICAL SPINE WITHOUT CONTRAST  TECHNIQUE: Multidetector CT imaging of the head and cervical spine was performed following the standard protocol without intravenous contrast. Multiplanar CT image reconstructions of the cervical spine were also generated.  RADIATION DOSE REDUCTION: This exam was performed according to  the departmental dose-optimization program which includes automated exposure control, adjustment of the mA and/or kV according to patient size and/or use of iterative reconstruction technique.  COMPARISON:  None Available.  FINDINGS: CT HEAD FINDINGS  Brain:  No evidence of large-territorial acute infarction. No parenchymal hemorrhage. No mass lesion. No extra-axial collection.  No mass effect or midline shift. No hydrocephalus. Basilar cisterns are patent.  Vascular: No hyperdense vessel.  Skull: No acute fracture or focal lesion.  Sinuses/Orbits: Paranasal sinuses and mastoid air cells are clear. The orbits are unremarkable.  Other: None.  CT CERVICAL SPINE FINDINGS  Alignment: Normal.  Skull base and vertebrae: No acute fracture. No aggressive appearing focal osseous lesion or focal pathologic process.  Soft tissues and spinal canal: No prevertebral fluid or swelling. No visible canal  hematoma.  Upper chest: Mild centrilobular emphysematous changes.  Other: None.  IMPRESSION: 1. No acute intracranial abnormality. 2. No acute displaced fracture or traumatic listhesis of the cervical spine.  Electronically Signed   By: Iven Finn M.D.   On: 09/21/2022 16:39 CT Head Wo Contrast CLINICAL DATA:  Facial trauma, blunt  EXAM: CT HEAD WITHOUT CONTRAST  CT CERVICAL SPINE WITHOUT CONTRAST  TECHNIQUE: Multidetector CT imaging of the head and cervical spine was performed following the standard protocol without intravenous contrast. Multiplanar CT image reconstructions of the cervical spine were also generated.  RADIATION DOSE REDUCTION: This exam was performed according to the departmental dose-optimization program which includes automated exposure control, adjustment of the mA and/or kV according to patient size and/or use of iterative reconstruction technique.  COMPARISON:  None Available.  FINDINGS: CT HEAD FINDINGS  Brain:  No evidence of  large-territorial acute infarction. No parenchymal hemorrhage. No mass lesion. No extra-axial collection.  No mass effect or midline shift. No hydrocephalus. Basilar cisterns are patent.  Vascular: No hyperdense vessel.  Skull: No acute fracture or focal lesion.  Sinuses/Orbits: Paranasal sinuses and mastoid air cells are clear. The orbits are unremarkable.  Other: None.  CT CERVICAL SPINE FINDINGS  Alignment: Normal.  Skull base and vertebrae: No acute fracture. No aggressive appearing focal osseous lesion or focal pathologic process.  Soft tissues and spinal canal: No prevertebral fluid or swelling. No visible canal hematoma.  Upper chest: Mild centrilobular emphysematous changes.  Other: None.  IMPRESSION: 1. No acute intracranial abnormality. 2. No acute displaced fracture or traumatic listhesis of the cervical spine.  Electronically Signed   By: Iven Finn M.D.   On: 09/21/2022 16:39    Past Medical History:  Diagnosis Date   Allergy    Anemia    Anxiety    Depression    GERD (gastroesophageal reflux disease)    No past surgical history on file. Family History  Problem Relation Age of Onset   Tuberculosis Father    Social History   Tobacco Use   Smoking status: Never    Passive exposure: Never   Smokeless tobacco: Never  Vaping Use   Vaping Use: Never used  Substance Use Topics   Alcohol use: Not Currently   Drug use: No   Current Outpatient Medications  Medication Sig Dispense Refill   ferrous gluconate (FERGON) 324 MG tablet 1 tab by mouth twice daily with small tangerine  3   Omega-3 Fatty Acids (OMEGA 3 PO) Take by mouth.     No current facility-administered medications for this visit.   No Known Allergies   Review of Systems: Positive for ***.  All other systems reviewed and negative except where noted in HPI.   Wt Readings from Last 3 Encounters:  09/21/22 136 lb 11 oz (62 kg)  09/09/22 137 lb (62.1 kg)  06/17/22 132 lb  4.4 oz (60 kg)    Physical Exam:  There were no vitals taken for this visit. Constitutional:  Pleasant, generally well appearing ***female in no acute distress. Psychiatric:  Normal mood and affect. Behavior is normal. EENT: Pupils normal.  Conjunctivae are normal. No scleral icterus. Neck supple.  Cardiovascular: Normal rate, regular rhythm.  Pulmonary/chest: Effort normal and breath sounds normal. No wheezing, rales or rhonchi. Abdominal: Soft, nondistended, nontender. Bowel sounds active throughout. There are no masses palpable. No hepatomegaly. Neurological: Alert and oriented to person place and time. Extremities: *** No edema Skin: Skin is warm and dry. No rashes noted.  Tye Savoy, NP  03/10/2023, 8:12 AM  Cc:  Referring Provider Mack Hook, MD

## 2023-04-23 ENCOUNTER — Ambulatory Visit: Payer: No Typology Code available for payment source | Admitting: Nurse Practitioner

## 2023-06-11 ENCOUNTER — Telehealth: Payer: Self-pay

## 2023-06-11 NOTE — Telephone Encounter (Signed)
Patient called to report that she would like to speak to Dr Delrae Alfred about some results. Patient has been going to a clinic in [place] (she did not want to tell me the name). She reports that results were unexpected and is not entirely sure it could be true (patient refused to give me further details).

## 2023-06-12 NOTE — Telephone Encounter (Signed)
Called patient to discuss her concerns--could only leave a message to call the office.

## 2023-06-12 NOTE — Telephone Encounter (Signed)
Has been diagnosed with chlamydia when went in for a pap smear.   She states she went to a clinic in [place] and am unable to get a clear reason as to why--states there was something going on with her. She is questioning the diagnosis as we tested her here for the same thing from her standpoint when she had her pap smear done. Discussed the pap was unnecessary and would have shared that with her had she had an appt with [place]. Discussed with her she needs to be having this conversation with the other physician. Explained to her we did not test her for chlamydia when she had her pap in 2022. She did have a chlamydia test in 2016 that was negative at another facility. She states she has not had intercourse for 12 years. Explained to her that she will need to come in and sign a release of information for me to get the other office's labs/reports We would be happy to test her again for chlamydia Bottom line, however, is she should be having this discussion with the physician that performed the testing. Based on what she is saying, sounds like she has not. As I was explaining to her again scheduling a time to come in and get retested and signing ROI, she did not reply and realize she had hung up.

## 2023-06-23 ENCOUNTER — Ambulatory Visit: Payer: Self-pay | Admitting: Gastroenterology

## 2023-07-13 ENCOUNTER — Ambulatory Visit: Payer: Self-pay | Admitting: Internal Medicine

## 2023-07-13 ENCOUNTER — Encounter: Payer: Self-pay | Admitting: Internal Medicine

## 2023-07-13 VITALS — BP 120/64 | HR 60 | Resp 12 | Ht <= 58 in | Wt 135.0 lb

## 2023-07-13 DIAGNOSIS — R6881 Early satiety: Secondary | ICD-10-CM

## 2023-07-13 DIAGNOSIS — R319 Hematuria, unspecified: Secondary | ICD-10-CM

## 2023-07-13 DIAGNOSIS — R300 Dysuria: Secondary | ICD-10-CM

## 2023-07-13 DIAGNOSIS — R1013 Epigastric pain: Secondary | ICD-10-CM

## 2023-07-13 DIAGNOSIS — D509 Iron deficiency anemia, unspecified: Secondary | ICD-10-CM

## 2023-07-13 DIAGNOSIS — L811 Chloasma: Secondary | ICD-10-CM

## 2023-07-13 LAB — POCT URINALYSIS DIPSTICK
Bilirubin, UA: NEGATIVE
Glucose, UA: NEGATIVE
Ketones, UA: NEGATIVE
Nitrite, UA: NEGATIVE
Protein, UA: NEGATIVE
Spec Grav, UA: 1.02 (ref 1.010–1.025)
Urobilinogen, UA: 0.2 E.U./dL
pH, UA: 6.5 (ref 5.0–8.0)

## 2023-07-13 MED ORDER — FAMOTIDINE 20 MG PO TABS: ORAL_TABLET | ORAL | 2 refills | Status: DC

## 2023-07-13 NOTE — Progress Notes (Signed)
Subjective:    Patient ID: Ashley Meyers, female   DOB: 1971/11/27, 52 y.o.   MRN: [id number]   HPI  Ashley Meyers  Poor historian.   Reported history of + chlamydia test at outside office:  reportedly had pap and chlamydia testing at an outside facility in [place]. Went to [facility] in [place] to have this done.  See phone note from June.  She continues to go back to the other office and now has a mammogram ordered through that clinic. Discussed she needs to make a decision regarding who is to be her primary care to avoid problems with her care and also to avoid duplication of services.  2.  LUQ pain:  First occurred about 1 year ago.  Describes a mild constant discomfort, but has episodes where it is worse.  Same discomfort, but worse--can also be on the right side, but left is always worse. She is unable to describe the discomfort. She cannot say if anything seems to bring on the increased discomfort. For past 3 months, she has a sense that food is not moving out of her stomach and fills up faster.  She does not feel this discomfort adds to the other discomfort, however.  The feeling of fullness now occurs with every meal.   Sounds like she was evaluated for these complaints at the other clinic when seen there in June.   Perhaps has had some black stools about 1-2 weeks ago.  Maybe 2 stools on separate days, not consecutive days.   No nausea, vomiting.   She has had a hard beginning of stool followed by loose stool.  Last episode was 3 weeks ago.  Can have 3 times in a week.   No alcohol or tobacco use.   Drinks coffee, 2 servings daily, regular sized cup. No soda. +tomatoes and onions. No pepto bismal, ibuprofen, aleve, ASA or other NSAID.   No change in either pain with passing BM.   No change in discomfort with urination.   Can have burning with urination --unable to get a clear picture when this occurred.    3.  Bruising:  Actually, only one bruise at  proximal medial left leg just below knee.       No outpatient medications have been marked as taking for the 07/13/23 encounter (Office Visit) with Julieanne Manson, MD.   No Known Allergies   Review of Systems    Objective:   BP 120/64 (BP Location: Right Arm, Patient Position: Sitting, Cuff Size: Normal)   Pulse 60   Resp 12   Ht 4\' 9"  (1.448 m)   Wt 135 lb (61.2 kg)   BMI 29.21 kg/m   Physical Exam NAD HEENT:  Patches of hyperpigmentation from anterior ear to jaw line and on forehead Lungs:  CTA CV:  RRR with normal S1 and S2, No S3, S4 or murmur.  Radial and DP pulses normal and equal Abd:  S, + BS, No HSM or mass.  Tenderness in left abdomen throughout and mildly in mid and right epigastrium.  No rebound or peritoneal sign.   LE:  circular 1 cm bruise at proximal medial LLE.  Mild tenderness.  No bruising elsewhere.   Assessment & Plan   Multiple GI complaints with mainly LUQ pain and early satiety.  Weight is actually stable.  Not clear what has been done at clinic in [place].  CBC, CMP, UA, FIT to return in 2 weeks.  Send for records from other office,  though patient has been somewhat reticent to do. Famotidine 40 mg daily at bedtime.  Asked her to take for 2 months.  Recheck in 2 months and then encouraged her to follow permanently with [facility] clinic to prevent problems with her primary care.   Patient has compliance issues:  after looking back at her lab results from her CPE in September of 2023, saw she was referred to [facility] for diagnostic colonoscopy due to microcytic anemia.  She appears to have canceled appts twice and no showed twice with GI. She did not bring in her FIT after multiple requests to do so.   She did not go in for her mammogram ordered in Sept. 2023 Will need to address with her last follow up in September.      2.  Bruising:  checking CBC, but discussed currently no worry with one bruise of LE.  To call or go to [facility] clinic if  develops more bruising or bleeding.  3.  Brings up dark patches of facial skin at discharge:  never pregnant.  Has used BCPs years ago.  Discussed she appears to have melasma.  Recommended starting with sunscreen 30 spf or higher.  May blend coloration with cosmetics or consider topical creams to lighten patches, but needs sunscreen as well.

## 2023-07-14 LAB — COMPREHENSIVE METABOLIC PANEL
ALT: 11 IU/L (ref 0–32)
AST: 19 IU/L (ref 0–40)
Albumin: 4.1 g/dL (ref 3.8–4.9)
Alkaline Phosphatase: 103 IU/L (ref 44–121)
BUN/Creatinine Ratio: 27 — ABNORMAL HIGH (ref 9–23)
BUN: 16 mg/dL (ref 6–24)
Bilirubin Total: <0.2 mg/dL (ref 0.0–1.2)
CO2: 24 mmol/L (ref 20–29)
Calcium: 8.8 mg/dL (ref 8.7–10.2)
Chloride: 104 mmol/L (ref 96–106)
Creatinine, Ser: 0.59 mg/dL (ref 0.57–1.00)
Globulin, Total: 2.8 g/dL (ref 1.5–4.5)
Glucose: 85 mg/dL (ref 70–99)
Potassium: 3.8 mmol/L (ref 3.5–5.2)
Sodium: 140 mmol/L (ref 134–144)
Total Protein: 6.9 g/dL (ref 6.0–8.5)
eGFR: 107 mL/min/{1.73_m2} (ref >59)

## 2023-07-14 LAB — CBC WITH DIFFERENTIAL/PLATELET
Basophils Absolute: 0.1 10*3/uL (ref 0.0–0.2)
Basos: 1 %
EOS (ABSOLUTE): 0.4 10*3/uL (ref 0.0–0.4)
Eos: 8 %
Hematocrit: 32.3 % — ABNORMAL LOW (ref 34.0–46.6)
Hemoglobin: 9.3 g/dL — ABNORMAL LOW (ref 11.1–15.9)
Immature Grans (Abs): 0 10*3/uL (ref 0.0–0.1)
Immature Granulocytes: 0 %
Lymphocytes Absolute: 1.7 10*3/uL (ref 0.7–3.1)
Lymphs: 32 %
MCH: 21.5 pg — ABNORMAL LOW (ref 26.6–33.0)
MCHC: 28.8 g/dL — ABNORMAL LOW (ref 31.5–35.7)
MCV: 75 fL — ABNORMAL LOW (ref 79–97)
Monocytes Absolute: 0.5 10*3/uL (ref 0.1–0.9)
Monocytes: 9 %
Neutrophils Absolute: 2.7 10*3/uL (ref 1.4–7.0)
Neutrophils: 50 %
Platelets: 294 10*3/uL (ref 150–450)
RBC: 4.32 x10E6/uL (ref 3.77–5.28)
RDW: 17.6 % — ABNORMAL HIGH (ref 11.7–15.4)
WBC: 5.3 10*3/uL (ref 3.4–10.8)

## 2023-07-15 LAB — URINE CULTURE

## 2023-07-15 NOTE — Addendum Note (Signed)
Addended by: Marcene Duos on: 07/15/2023 09:44 AM   Modules accepted: Orders

## 2023-07-28 ENCOUNTER — Telehealth: Payer: Self-pay

## 2023-07-28 NOTE — Telephone Encounter (Signed)
Patient called twice to ask for lab results

## 2023-08-13 ENCOUNTER — Encounter: Payer: Self-pay | Admitting: Gastroenterology

## 2023-09-10 ENCOUNTER — Encounter: Payer: Self-pay | Admitting: Internal Medicine

## 2023-09-10 ENCOUNTER — Telehealth: Payer: Self-pay

## 2023-09-10 ENCOUNTER — Ambulatory Visit: Payer: Self-pay | Admitting: Internal Medicine

## 2023-09-10 NOTE — Telephone Encounter (Signed)
Patient needs an appointment for GI symptoms .  We will call patient if there is a cancellation.

## 2023-09-18 ENCOUNTER — Encounter: Payer: Self-pay | Admitting: Internal Medicine

## 2023-10-15 ENCOUNTER — Encounter: Payer: Self-pay | Admitting: Internal Medicine

## 2023-10-15 ENCOUNTER — Ambulatory Visit: Payer: Self-pay | Admitting: Internal Medicine

## 2023-10-15 VITALS — BP 120/70 | HR 63 | Resp 16 | Ht <= 58 in | Wt 135.0 lb

## 2023-10-15 DIAGNOSIS — Z59869 Financial insecurity, unspecified: Secondary | ICD-10-CM

## 2023-10-15 DIAGNOSIS — Z5986 Financial insecurity: Secondary | ICD-10-CM

## 2023-10-15 DIAGNOSIS — F411 Generalized anxiety disorder: Secondary | ICD-10-CM

## 2023-10-15 DIAGNOSIS — G5603 Carpal tunnel syndrome, bilateral upper limbs: Secondary | ICD-10-CM

## 2023-10-15 DIAGNOSIS — F439 Reaction to severe stress, unspecified: Secondary | ICD-10-CM

## 2023-10-15 DIAGNOSIS — K582 Mixed irritable bowel syndrome: Secondary | ICD-10-CM

## 2023-10-15 MED ORDER — CITALOPRAM HYDROBROMIDE 10 MG PO TABS: ORAL_TABLET | ORAL | 4 refills | Status: AC

## 2023-10-15 MED ORDER — DICYCLOMINE HCL 20 MG PO TABS: ORAL_TABLET | ORAL | 11 refills | Status: AC

## 2023-10-15 NOTE — Progress Notes (Signed)
    Subjective:    Patient ID: Ashley Meyers, female   DOB: 08-08-1971, 52 y.o.   MRN: [id number]   HPI   Abdominal pain:  points this visit to bilateral lower quadrants.  States this is different from the chronic issue she was having in July--see that visit on 7/22.   The pain is intermittent and states has had for 1 1/2 years.  Feels like a twisting in her abdomen.  States this past week, she had the pain radiate up from bilateral lower quadrants through epigastric area/sternal area to throat--similar twisting pain.  States lasted for a minute.   Previously, the pain would occur for about 2 minutes with just the lower abdominal pain.   She has had about 4 total episodes over past 1.5 years.   Not clear, but sounds like she feels her abdomen is bloated most of the time over the same 1.5 year time period.  She feels short of breath when her abdomen is bloated.  Seems she has had shortness of breath more frequently over past month.   Then relates an episode in 10/2021 when she passed out and was seen by a cardiologist, sounds like underwent ECG and perhaps echo and blood tests.  Was told everything was fine.  She feels her breathing has been a problem since then.    No melena or hematochezia.  Occasionally, with diarrhea.  No constipation.  Diarrhea has resolved since she started eating healthier.  Has not been a problem for a month.  2.  Bilateral hand numbness that awakens her from sleep.  A problem for past 2 months.  Now also with numbness in her feet, more so during the day--the entire plantar surface.  If she gets off her feet, the numbness goes away.  The numbness can occur 5-6 times in a month.  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  Current Meds  Medication Sig   ferrous gluconate (FERGON) 324 MG tablet 1 tab by mouth twice daily with small tangerine   MAGNESIUM CITRATE PO Take by mouth daily.   No Known  Allergies   Review of Systems    Objective:   BP 120/70 (BP Location: Left Arm, Patient Position: Sitting, Cuff Size: Normal)   Pulse 63   Resp 16   Ht 4\' 9"  (1.448 m)   Wt 135 lb (61.2 kg)   BMI 29.21 kg/m   Physical Exam NAD Lungs:  CTA CV:  RRR without murmur or rub.  Radial and DP pulses normal and equal Abd:  S, NT, NO HSM or mass, + BS LE  No edem UE:  possibly mild Tinels over right volar wrist.  Mildly + Phalens bilaterally.  Good grip bilaterally.  Assessment & Plan  IBS and anxiety/stress:  she is already referred to GI for iron deficiency anemia and more epigastric pain and early satiety.  These symptoms seem more like IBS.  Start dicyclomine as needed every 6-8 hours.  Citalopram 5 mg for 7 days, then 10 mg daily.  Follow up in 1-2 weeks and 6-8 weeks.    Referral to [facility], [facility] for counseling as well as assessment of food or other needs.  Not clear she will follow through on any of this.  2.  Bilateral Carpal Tunnel syndrome:  bilateral carpal tunnel syndrome.

## 2023-10-15 NOTE — Telephone Encounter (Signed)
Patient has been scheduled

## 2023-10-15 NOTE — Telephone Encounter (Signed)
Called patient to offer appointment patient did not answer.

## 2023-10-28 ENCOUNTER — Ambulatory Visit: Payer: Self-pay | Admitting: Gastroenterology

## 2023-10-29 ENCOUNTER — Other Ambulatory Visit (INDEPENDENT_AMBULATORY_CARE_PROVIDER_SITE_OTHER): Payer: Self-pay

## 2023-10-29 ENCOUNTER — Ambulatory Visit (INDEPENDENT_AMBULATORY_CARE_PROVIDER_SITE_OTHER): Payer: Self-pay | Admitting: Nurse Practitioner

## 2023-10-29 VITALS — BP 98/68 | HR 66 | Ht <= 58 in | Wt 135.4 lb

## 2023-10-29 DIAGNOSIS — D509 Iron deficiency anemia, unspecified: Secondary | ICD-10-CM

## 2023-10-29 DIAGNOSIS — R1013 Epigastric pain: Secondary | ICD-10-CM

## 2023-10-29 LAB — IBC + FERRITIN
Ferritin: 2.3 ng/mL — ABNORMAL LOW (ref 10.0–291.0)
Iron: 27 ug/dL — ABNORMAL LOW (ref 42–145)
Saturation Ratios: 5.4 % — ABNORMAL LOW (ref 20.0–50.0)
TIBC: 498.4 ug/dL — ABNORMAL HIGH (ref 250.0–450.0)
Transferrin: 356 mg/dL (ref 212.0–360.0)

## 2023-10-29 LAB — COMPREHENSIVE METABOLIC PANEL
ALT: 16 U/L (ref 0–35)
AST: 21 U/L (ref 0–37)
Albumin: 4.1 g/dL (ref 3.5–5.2)
Alkaline Phosphatase: 88 U/L (ref 39–117)
BUN: 11 mg/dL (ref 6–23)
CO2: 29 meq/L (ref 19–32)
Calcium: 8.8 mg/dL (ref 8.4–10.5)
Chloride: 105 meq/L (ref 96–112)
Creatinine, Ser: 0.58 mg/dL (ref 0.40–1.20)
GFR: 102.65 mL/min (ref >60.00)
Glucose, Bld: 95 mg/dL (ref 70–99)
Potassium: 3.2 meq/L — ABNORMAL LOW (ref 3.5–5.1)
Sodium: 141 meq/L (ref 135–145)
Total Bilirubin: 0.3 mg/dL (ref 0.2–1.2)
Total Protein: 7.2 g/dL (ref 6.0–8.3)

## 2023-10-29 LAB — CBC WITH DIFFERENTIAL/PLATELET
Basophils Absolute: 0.1 10*3/uL (ref 0.0–0.1)
Basophils Relative: 0.9 % (ref 0.0–3.0)
Eosinophils Absolute: 0.3 10*3/uL (ref 0.0–0.7)
Eosinophils Relative: 4.7 % (ref 0.0–5.0)
HCT: 32.1 % — ABNORMAL LOW (ref 36.0–46.0)
Hemoglobin: 10.1 g/dL — ABNORMAL LOW (ref 12.0–15.0)
Lymphocytes Relative: 32.9 % (ref 12.0–46.0)
Lymphs Abs: 1.9 10*3/uL (ref 0.7–4.0)
MCHC: 31.4 g/dL (ref 30.0–36.0)
MCV: 72.8 fL — ABNORMAL LOW (ref 78.0–100.0)
Monocytes Absolute: 0.3 10*3/uL (ref 0.1–1.0)
Monocytes Relative: 5.5 % (ref 3.0–12.0)
Neutro Abs: 3.3 10*3/uL (ref 1.4–7.7)
Neutrophils Relative %: 56 % (ref 43.0–77.0)
Platelets: 279 10*3/uL (ref 150.0–400.0)
RBC: 4.42 Mil/uL (ref 3.87–5.11)
RDW: 20.5 % — ABNORMAL HIGH (ref 11.5–15.5)
WBC: 5.8 10*3/uL (ref 4.0–10.5)

## 2023-10-29 MED ORDER — OMEPRAZOLE 20 MG PO CPDR: 20.0000 mg | DELAYED_RELEASE_CAPSULE | Freq: Every day | ORAL | 1 refills | Status: AC

## 2023-10-29 MED ORDER — NA SULFATE-K SULFATE-MG SULF 17.5-3.13-1.6 GM/177ML PO SOLN: 1.0000 | Freq: Once | ORAL | 0 refills | Status: AC

## 2023-10-29 MED ORDER — OMEPRAZOLE 20 MG PO CPDR: 20.0000 mg | DELAYED_RELEASE_CAPSULE | Freq: Every day | ORAL | 3 refills | Status: DC

## 2023-10-29 NOTE — Progress Notes (Signed)
Agree with assessment / plan as outlined.  

## 2023-10-29 NOTE — Patient Instructions (Addendum)
You have been scheduled for an endoscopy and colonoscopy. Please follow the written instructions given to you at your visit today.  Please pick up your prep supplies at the pharmacy within the next 1-3 days.  If you use inhalers (even only as needed), please bring them with you on the day of your procedure. ___________________________________________________________________________ We have sent the following medications to your pharmacy for you to pick up at your convenience: Suprep and Omeprazole  Your provider has requested that you go to the basement level for lab work before leaving today. Press "B" on the elevator. The lab is located at the first door on the left as you exit the elevator.  Due to recent changes in healthcare laws, you may see the results of your imaging and laboratory studies on MyChart before your provider has had a chance to review them.  We understand that in some cases there may be results that are confusing or concerning to you. Not all laboratory results come back in the same time frame and the provider may be waiting for multiple results in order to interpret others.  Please give [place] 48 hours in order for your provider to thoroughly review all the results before contacting the office for clarification of your results.   Thank you for trusting me with your gastrointestinal care!   Alcide Evener, CRNP

## 2023-10-29 NOTE — Progress Notes (Signed)
10/29/2023 Ashley Meyers [id number] [birth year]   CHIEF COMPLAINT: Anemia, upper abdominal pain   HISTORY OF PRESENT ILLNESS: Ashley Meyers is a 52 year old female with a past medical history of anxiety, depression, anemia and GERD.  No past surgical history. She presents to our office today as referred by Dr. Julieanne Manson for further evaluation regarding early satiety, epigastric pain and anemia.  She speaks Spanish therefore she is accompanied by [facility] Spanish interpreter who was present to facilitate communication throughout today's consult visit. She denies having any nausea or vomiting. No heartburn of dysphagia. She denied getting full easily.  She has intermittent epigastric pain which is not triggered eating but is more noticeable she does not drink enough water.  Her bowel pattern varies, she sometimes passes normal stools and sometimes has loose stools.  No bloody or black stools.  No NSAID use.  No alcohol use.  She endorses losing 20 pounds over the past 2 years.  She sometimes has night sweats.  She is postmenopausal x 1 1/2 to 2 years.  she previously had intermittent heavy menstrual bleeding.  She denies ever undergoing an EGD or screening colonoscopy.  No known family history of gastric or colorectal cancer.  He is taken over-the-counter liquid iron formulation 1 to 2 teaspoons daily for the past few months.      Latest Ref Rng & Units 07/13/2023    9:35 AM 09/09/2022   12:30 PM 02/01/2021    1:12 PM  CBC  WBC 3.4 - 10.8 x10E3/uL 5.3  4.8  4.9   Hemoglobin 11.1 - 15.9 g/dL 9.3  9.2  9.5   Hematocrit 34.0 - 46.6 % 32.3  31.0  32.0   Platelets 150 - 450 x10E3/uL 294  280  398        Latest Ref Rng & Units 07/13/2023    9:35 AM 09/09/2022   12:30 PM 02/01/2021    1:12 PM  CMP  Glucose 70 - 99 mg/dL 85  88  74   BUN 6 - 24 mg/dL 16  15  13    Creatinine 0.57 - 1.00 mg/dL 3.08  6.57  8.46   Sodium 134 - 144 mmol/L 140  142  141   Potassium 3.5 - 5.2  mmol/L 3.8  3.7  4.5   Chloride 96 - 106 mmol/L 104  105  104   CO2 20 - 29 mmol/L 24  25  22    Calcium 8.7 - 10.2 mg/dL 8.8  8.7  9.2   Total Protein 6.0 - 8.5 g/dL 6.9  7.0  7.3   Total Bilirubin 0.0 - 1.2 mg/dL <9.6  0.2  0.3   Alkaline Phos 44 - 121 IU/L 103  81  78   AST 0 - 40 IU/L 19  13  15    ALT 0 - 32 IU/L 11  8  10    Labs 09/23/2022: Iron 28.  Iron saturation 7%.  B12 level 332.  Social History: She is single.  Non-smoker.  No alcohol use.  No drug use.  Family History: Father with history of tuberculosis.  No known family history of esophageal, gastric or colorectal cancer.  No Known Allergies    Outpatient Encounter Medications as of 10/29/2023  Medication Sig   citalopram (CELEXA) 10 MG tablet 1/2 tab by mouth daily for 7 days then 1 tab by mouth daily.   dicyclomine (BENTYL) 20 MG tablet 1/2 to 1 tab by mouth every 8 hours as needed  famotidine (PEPCID) 20 MG tablet 2 tabs by mouth at bedtime daily (Patient not taking: Reported on 10/15/2023)   ferrous gluconate (FERGON) 324 MG tablet 1 tab by mouth twice daily with small tangerine   MAGNESIUM CITRATE PO Take by mouth daily.   Omega-3 Fatty Acids (OMEGA 3 PO) Take by mouth. (Patient not taking: Reported on 07/13/2023)   No facility-administered encounter medications on file as of 10/29/2023.   REVIEW OF SYSTEMS:  Gen: + Fatigue. See HPI.  CV: Denies chest pain, palpitations or edema. Resp: Denies cough, shortness of breath of hemoptysis.  GI: See HPI. GU: + Excessive urination.  MS: + Muscle cramps.  Derm: ? Rash.  Psych: + Anxiety and depression.  Heme: Denies bruising, easy bleeding. Neuro:  + Dizziness.  Endo:  Denies any problems with DM, thyroid or adrenal function.  PHYSICAL EXAM: BP 98/68   Pulse 66   Ht 4\' 9"  (1.448 m)   Wt 135 lb 6 oz (61.4 kg)   BMI 29.29 kg/m  Wt Readings from Last 3 Encounters:  10/29/23 135 lb 6 oz (61.4 kg)  10/15/23 135 lb (61.2 kg)  07/13/23 135 lb (61.2 kg)     General: 52 year old female in no acute distress. Head: Normocephalic and atraumatic. Eyes:  Sclerae non-icteric, conjunctive pink. Ears: Normal auditory acuity. Mouth: Dentition intact, few missing teeth.  No ulcers or lesions.  Neck: Supple, no lymphadenopathy or thyromegaly.  Lungs: Clear bilaterally to auscultation without wheezes, crackles or rhonchi. Heart: Regular rate and rhythm. No murmur, rub or gallop appreciated.  Abdomen: Soft, nondistended.  Mild epigastric and RUQ tenderness without rebound or guarding.  No masses. No hepatosplenomegaly. Normoactive bowel sounds x 4 quadrants.  Rectal: Deferred.  Musculoskeletal: Symmetrical with no gross deformities. Skin: Warm and dry. No rash or lesions on visible extremities. Extremities: No edema. Neurological: Alert oriented x 4, no focal deficits.  Psychological:  Alert and cooperative. Normal mood and affect.  ASSESSMENT AND PLAN:  52 year old female with iron deficiency anemia.  On liquid iron OTC daily.  Postmenopausal x 1 1/2 to 2 years. -EGD and colonoscopy benefits and risks discussed including risk with sedation, risk of bleeding, perforation and infection  -CBC, IBC + ferritin -CTAP if EGD and colonoscopy unrevealing -GERD/ulcer diet handout -Omeprazole 20 mg 1 tab p.o. daily -Further recommendations to be determined after EGD and colonoscopy completed  Epigastric pain -See plan above  -CMP    CC:  Julieanne Manson, MD

## 2023-11-03 ENCOUNTER — Ambulatory Visit: Payer: Self-pay | Admitting: Internal Medicine

## 2023-11-05 ENCOUNTER — Other Ambulatory Visit: Payer: Self-pay

## 2023-11-05 DIAGNOSIS — D509 Iron deficiency anemia, unspecified: Secondary | ICD-10-CM

## 2023-11-05 MED ORDER — IRON (FERROUS SULFATE) 325 (65 FE) MG PO TABS: 325.0000 mg | ORAL_TABLET | Freq: Every day | ORAL | 3 refills | Status: AC

## 2023-11-17 ENCOUNTER — Ambulatory Visit: Payer: Self-pay | Admitting: Internal Medicine

## 2023-12-07 ENCOUNTER — Encounter: Payer: Self-pay | Admitting: Certified Registered Nurse Anesthetist

## 2023-12-09 ENCOUNTER — Telehealth: Payer: Self-pay | Admitting: Gastroenterology

## 2023-12-09 ENCOUNTER — Encounter: Payer: Self-pay | Admitting: Gastroenterology

## 2023-12-09 NOTE — Telephone Encounter (Signed)
Okay thanks for letting me know, sorry to hear this.  

## 2023-12-09 NOTE — Telephone Encounter (Signed)
Patient called requested to cancel her procedure scheduled for this afternoon stated she is not feeling well.

## 2024-02-22 ENCOUNTER — Encounter: Payer: Self-pay | Admitting: Internal Medicine

## 2024-02-22 ENCOUNTER — Telehealth: Payer: Self-pay

## 2024-02-22 ENCOUNTER — Ambulatory Visit: Payer: Self-pay | Admitting: Internal Medicine

## 2024-02-22 VITALS — BP 100/80 | HR 65 | Resp 16 | Ht <= 58 in | Wt 144.0 lb

## 2024-02-22 DIAGNOSIS — R1013 Epigastric pain: Secondary | ICD-10-CM

## 2024-02-22 DIAGNOSIS — D509 Iron deficiency anemia, unspecified: Secondary | ICD-10-CM

## 2024-02-22 DIAGNOSIS — M65311 Trigger thumb, right thumb: Secondary | ICD-10-CM | POA: Insufficient documentation

## 2024-02-22 DIAGNOSIS — L853 Xerosis cutis: Secondary | ICD-10-CM | POA: Insufficient documentation

## 2024-02-22 DIAGNOSIS — F411 Generalized anxiety disorder: Secondary | ICD-10-CM | POA: Insufficient documentation

## 2024-02-22 DIAGNOSIS — R6881 Early satiety: Secondary | ICD-10-CM

## 2024-02-22 NOTE — Telephone Encounter (Signed)
 Patient would like a sooner appointment than 04/27/2024.

## 2024-02-22 NOTE — Progress Notes (Signed)
 Subjective:    Patient ID: Ashley Meyers, female   DOB: 04/09/1971, 53 y.o.   MRN: [id number]   HPI  Ashley Meyers interprets   IBS and anxiety:  See October note.  Never picked up Citalopram or dicyclomine following October visit.  States every time she went to the pharmacy, they were closed.  She did not follow up as recommended--two cancelled appts thereafter.    2.  Anemia with abdominal complaints:  She did not follow through with the EGD and colonoscopy set up with Dr. Adela Lank.  First states no one set her up with anything and then when finding the documentation of her phone call canceling the procedures, she states she cancelled because she hasn't been feeling well.   Her iron studies remained low in November as did hemoglobin, though slightly higher at 10.1.  She also is not taking Omeprazole 20 mg daily that was ordered by GI.  She states she is no longer having the sense of food coming up her esophagus.  3.  Headaches which she relates she felt were caused by switch to ferrous sulfate from gluconate.  She ultimately switched back but the headaches continue.    4.  Right thumb with triggering.  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  5.  Itchy back:  she cannot recall the type of soap she uses.  Water not hot in shower, but it does hit her back.  6.  Hands god numb on thumb side when sleeping--shakes out.  Never obtained cock up splints.  After circular conversation, not sure what her barriers are.  She voiced wanting another referral to Dr. Adela Lank at this time, but she states she doesn't know where all her paperwork is to complete the financial assistance.   Current Meds  Medication Sig   ferrous gluconate (FERGON) 324 MG tablet 1 tab by mouth twice daily with small tangerine   No Known Allergies   Review of Systems    Objective:   BP 100/80 (BP Location: Left Arm, Patient Position: Sitting, Cuff Size:  Normal)   Pulse 65   Resp 16   Ht 4\' 9"  (1.448 m)   Wt 144 lb (65.3 kg)   SpO2 97%   BMI 31.16 kg/m   Physical Exam NAD Lungs:  CTA CV:  RRR without murmur or rub Abd:  S + BS, mild epigastric tenderness  No rebound or peritoneal signs. LE:  No edema.   Skin of back:  mild dryness.   Assessment & Plan   Anxiety:  feel this is her most significant issue and keeps her from taking meds and following through.  Have been unable to get her on medication or consider counseling.  2.  Abdominal complaints with iron deficiency anemia:  not clear how often taking iron.  Check CBC, CMP.  Feel some of this may be gastric related and concern for gastritis/PUD with blood loss and also IBS as well.  She has not filled any of the meds prescribed.  Encouraged her to fill Omeprazole 20 mg daily and how to take.   Iron should be twice daily  Referral back to Dr. Adela Lank and discussed she will need to apply for [facility] Financial Assistance.  Went over how to do so.    3.  Dry skin:  Dove soap, move around in shower so spray hits other areas.  Eucerin Cream for eczema relief.  4.  Carpal Tunnel  syndrome:  cock up splints nightly.  5.  Right trigger thumb:  return for injection and splinting.

## 2024-02-22 NOTE — Patient Instructions (Addendum)
 Cock up splint for both hands  Dove soap for bathing Lukewarm showers Eucerin Cream for eczema relief to skin on back and all over after pat dry from shower.

## 2024-02-23 LAB — CBC WITH DIFFERENTIAL/PLATELET
Basophils Absolute: 0.1 10*3/uL (ref 0.0–0.2)
Basos: 1 %
EOS (ABSOLUTE): 0.3 10*3/uL (ref 0.0–0.4)
Eos: 6 %
Hematocrit: 34.2 % (ref 34.0–46.6)
Hemoglobin: 10.7 g/dL — ABNORMAL LOW (ref 11.1–15.9)
Immature Grans (Abs): 0 10*3/uL (ref 0.0–0.1)
Immature Granulocytes: 0 %
Lymphocytes Absolute: 2 10*3/uL (ref 0.7–3.1)
Lymphs: 38 %
MCH: 24.3 pg — ABNORMAL LOW (ref 26.6–33.0)
MCHC: 31.3 g/dL — ABNORMAL LOW (ref 31.5–35.7)
MCV: 78 fL — ABNORMAL LOW (ref 79–97)
Monocytes Absolute: 0.4 10*3/uL (ref 0.1–0.9)
Monocytes: 8 %
Neutrophils Absolute: 2.4 10*3/uL (ref 1.4–7.0)
Neutrophils: 47 %
Platelets: 265 10*3/uL (ref 150–450)
RBC: 4.4 x10E6/uL (ref 3.77–5.28)
RDW: 17.6 % — ABNORMAL HIGH (ref 11.7–15.4)
WBC: 5.2 10*3/uL (ref 3.4–10.8)

## 2024-02-23 LAB — COMPREHENSIVE METABOLIC PANEL
ALT: 11 IU/L (ref 0–32)
AST: 21 IU/L (ref 0–40)
Albumin: 4.1 g/dL (ref 3.8–4.9)
Alkaline Phosphatase: 108 IU/L (ref 44–121)
BUN/Creatinine Ratio: 24 — ABNORMAL HIGH (ref 9–23)
BUN: 16 mg/dL (ref 6–24)
Bilirubin Total: <0.2 mg/dL (ref 0.0–1.2)
CO2: 21 mmol/L (ref 20–29)
Calcium: 8.8 mg/dL (ref 8.7–10.2)
Chloride: 107 mmol/L — ABNORMAL HIGH (ref 96–106)
Creatinine, Ser: 0.67 mg/dL (ref 0.57–1.00)
Globulin, Total: 2.4 g/dL (ref 1.5–4.5)
Glucose: 117 mg/dL — ABNORMAL HIGH (ref 70–99)
Potassium: 3.7 mmol/L (ref 3.5–5.2)
Sodium: 142 mmol/L (ref 134–144)
Total Protein: 6.5 g/dL (ref 6.0–8.5)
eGFR: 104 mL/min/{1.73_m2} (ref >59)

## 2024-03-07 ENCOUNTER — Encounter: Payer: Self-pay | Admitting: Internal Medicine

## 2024-04-05 NOTE — Telephone Encounter (Signed)
 Patient has been scheduled

## 2024-04-06 ENCOUNTER — Encounter: Payer: Self-pay | Admitting: Internal Medicine

## 2024-04-06 ENCOUNTER — Ambulatory Visit (INDEPENDENT_AMBULATORY_CARE_PROVIDER_SITE_OTHER): Payer: Self-pay | Admitting: Internal Medicine

## 2024-04-06 VITALS — BP 110/76 | HR 64 | Resp 16 | Ht <= 58 in | Wt 139.0 lb

## 2024-04-06 DIAGNOSIS — M65311 Trigger thumb, right thumb: Secondary | ICD-10-CM

## 2024-04-06 NOTE — Progress Notes (Signed)
 Patient was here for trigger thumb injection, but after discussing risk benefits, she elected not to proceed. Injection cancelled.

## 2024-04-27 ENCOUNTER — Ambulatory Visit: Payer: Self-pay | Admitting: Internal Medicine
